# Patient Record
Sex: Male | Born: 1960 | Race: White | Hispanic: No | Marital: Single | Smoking: Never smoker
Health system: Southern US, Community
[De-identification: ages and names within clinical notes are randomized; demographics above are authoritative.]

## PROBLEM LIST (undated history)

## (undated) DIAGNOSIS — M1990 Unspecified osteoarthritis, unspecified site: Secondary | ICD-10-CM

## (undated) DIAGNOSIS — K648 Other hemorrhoids: Secondary | ICD-10-CM

## (undated) DIAGNOSIS — F32A Depression, unspecified: Secondary | ICD-10-CM

## (undated) DIAGNOSIS — G709 Myoneural disorder, unspecified: Secondary | ICD-10-CM

## (undated) DIAGNOSIS — Z8616 Personal history of COVID-19: Secondary | ICD-10-CM

## (undated) DIAGNOSIS — Z973 Presence of spectacles and contact lenses: Secondary | ICD-10-CM

## (undated) DIAGNOSIS — F419 Anxiety disorder, unspecified: Secondary | ICD-10-CM

## (undated) DIAGNOSIS — F329 Major depressive disorder, single episode, unspecified: Secondary | ICD-10-CM

## (undated) DIAGNOSIS — K219 Gastro-esophageal reflux disease without esophagitis: Secondary | ICD-10-CM

## (undated) DIAGNOSIS — K311 Adult hypertrophic pyloric stenosis: Secondary | ICD-10-CM

## (undated) DIAGNOSIS — G629 Polyneuropathy, unspecified: Secondary | ICD-10-CM

## (undated) DIAGNOSIS — E785 Hyperlipidemia, unspecified: Secondary | ICD-10-CM

## (undated) DIAGNOSIS — Z5189 Encounter for other specified aftercare: Secondary | ICD-10-CM

## (undated) DIAGNOSIS — IMO0002 Reserved for concepts with insufficient information to code with codable children: Secondary | ICD-10-CM

## (undated) HISTORY — DX: Major depressive disorder, single episode, unspecified: F32.9

## (undated) HISTORY — PX: BACK SURGERY: SHX140

## (undated) HISTORY — DX: Depression, unspecified: F32.A

## (undated) HISTORY — PX: FOOT SURGERY: SHX648

## (undated) HISTORY — DX: Hyperlipidemia, unspecified: E78.5

## (undated) HISTORY — DX: Reserved for concepts with insufficient information to code with codable children: IMO0002

## (undated) HISTORY — DX: Adult hypertrophic pyloric stenosis: K31.1

## (undated) HISTORY — DX: Myoneural disorder, unspecified: G70.9

## (undated) HISTORY — PX: OTHER SURGICAL HISTORY: SHX169

## (undated) HISTORY — DX: Polyneuropathy, unspecified: G62.9

## (undated) HISTORY — DX: Gastro-esophageal reflux disease without esophagitis: K21.9

## (undated) HISTORY — DX: Encounter for other specified aftercare: Z51.89

---

## 1998-03-31 DIAGNOSIS — Z5189 Encounter for other specified aftercare: Secondary | ICD-10-CM

## 1998-03-31 DIAGNOSIS — S060XAA Concussion with loss of consciousness status unknown, initial encounter: Secondary | ICD-10-CM

## 1998-03-31 DIAGNOSIS — IMO0002 Reserved for concepts with insufficient information to code with codable children: Secondary | ICD-10-CM

## 1998-03-31 HISTORY — DX: Encounter for other specified aftercare: Z51.89

## 1998-03-31 HISTORY — DX: Concussion with loss of consciousness status unknown, initial encounter: S06.0XAA

## 1998-03-31 HISTORY — PX: OTHER SURGICAL HISTORY: SHX169

## 1998-03-31 HISTORY — DX: Reserved for concepts with insufficient information to code with codable children: IMO0002

## 1998-03-31 HISTORY — PX: ANKLE FRACTURE SURGERY: SHX122

## 2008-08-10 ENCOUNTER — Inpatient Hospital Stay (HOSPITAL_COMMUNITY): Admission: EM | Admit: 2008-08-10 | Discharge: 2008-08-14 | Payer: Self-pay | Admitting: Emergency Medicine

## 2009-03-31 DIAGNOSIS — G5603 Carpal tunnel syndrome, bilateral upper limbs: Secondary | ICD-10-CM | POA: Insufficient documentation

## 2010-07-09 LAB — BASIC METABOLIC PANEL
CO2: 30 mEq/L (ref 19–32)
CO2: 31 mEq/L (ref 19–32)
Calcium: 8.7 mg/dL (ref 8.4–10.5)
Calcium: 8.2 mg/dL — ABNORMAL LOW (ref 8.4–10.5)
Chloride: 101 mEq/L (ref 96–112)
Chloride: 104 mEq/L (ref 96–112)
Chloride: 99 mEq/L (ref 96–112)
Creatinine, Ser: 0.71 mg/dL (ref 0.4–1.5)
GFR calc Af Amer: >60 mL/min (ref >60)
GFR calc Af Amer: >60 mL/min (ref >60)
GFR calc Af Amer: >60 mL/min (ref >60)
GFR calc non Af Amer: >60 mL/min (ref >60)
Glucose, Bld: 104 mg/dL — ABNORMAL HIGH (ref 70–99)
Potassium: 3.8 mEq/L (ref 3.5–5.1)
Sodium: 135 mEq/L (ref 135–145)
Sodium: 137 mEq/L (ref 135–145)

## 2010-07-09 LAB — CULTURE, BLOOD (ROUTINE X 2)
Culture: NO GROWTH
Culture: NO GROWTH

## 2010-07-09 LAB — CBC
HCT: 37.3 % — ABNORMAL LOW (ref 39.0–52.0)
HCT: 42.6 % (ref 39.0–52.0)
Hemoglobin: 12.6 g/dL — ABNORMAL LOW (ref 13.0–17.0)
Hemoglobin: 14.5 g/dL (ref 13.0–17.0)
MCHC: 33.7 g/dL (ref 30.0–36.0)
MCHC: 34.1 g/dL (ref 30.0–36.0)
MCV: 93.3 fL (ref 78.0–100.0)
MCV: 92.7 fL (ref 78.0–100.0)
MCV: 94.1 fL (ref 78.0–100.0)
RBC: 3.93 MIL/uL — ABNORMAL LOW (ref 4.22–5.81)
RBC: 3.96 MIL/uL — ABNORMAL LOW (ref 4.22–5.81)
RBC: 4.6 MIL/uL (ref 4.22–5.81)
RDW: 13.6 % (ref 11.5–15.5)
WBC: 5.9 10*3/uL (ref 4.0–10.5)

## 2010-07-09 LAB — DIFFERENTIAL
Basophils Relative: 1 % (ref 0–1)
Eosinophils Absolute: 0.1 10*3/uL (ref 0.0–0.7)
Lymphs Abs: 1.2 10*3/uL (ref 0.7–4.0)
Monocytes Absolute: 0.4 10*3/uL (ref 0.1–1.0)
Monocytes Relative: 4 % (ref 3–12)

## 2010-07-09 LAB — WOUND CULTURE

## 2010-07-09 LAB — RAPID URINE DRUG SCREEN, HOSP PERFORMED
Barbiturates: NOT DETECTED
Opiates: POSITIVE — AB
Tetrahydrocannabinol: NOT DETECTED
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
## 2011-04-01 HISTORY — PX: FOOT SURGERY: SHX648

## 2013-02-10 DIAGNOSIS — M545 Low back pain, unspecified: Secondary | ICD-10-CM | POA: Insufficient documentation

## 2013-02-11 ENCOUNTER — Other Ambulatory Visit: Payer: Self-pay | Admitting: Neurosurgery

## 2013-02-11 DIAGNOSIS — M545 Low back pain, unspecified: Secondary | ICD-10-CM

## 2013-02-17 ENCOUNTER — Ambulatory Visit
Admission: RE | Admit: 2013-02-17 | Discharge: 2013-02-17 | Disposition: A | Discharge status: Home / Self Care | Payer: Medicare Other | Source: Ambulatory Visit | Attending: Neurosurgery | Admitting: Neurosurgery

## 2013-02-17 ENCOUNTER — Other Ambulatory Visit: Payer: Self-pay | Admitting: Neurosurgery

## 2013-02-17 DIAGNOSIS — M545 Low back pain, unspecified: Secondary | ICD-10-CM

## 2013-02-20 ENCOUNTER — Other Ambulatory Visit: Payer: Self-pay | Admitting: Neurosurgery

## 2013-02-20 ENCOUNTER — Other Ambulatory Visit: Payer: Medicare Other

## 2013-02-20 ENCOUNTER — Ambulatory Visit
Admission: RE | Admit: 2013-02-20 | Discharge: 2013-02-20 | Disposition: A | Discharge status: Home / Self Care | Payer: Medicare Other | Source: Ambulatory Visit | Attending: Neurosurgery | Admitting: Neurosurgery

## 2013-02-20 DIAGNOSIS — M545 Low back pain, unspecified: Secondary | ICD-10-CM

## 2013-03-04 ENCOUNTER — Other Ambulatory Visit: Payer: Self-pay | Admitting: Neurosurgery

## 2013-03-04 ENCOUNTER — Encounter: Payer: Self-pay | Admitting: Internal Medicine

## 2013-03-04 DIAGNOSIS — M545 Low back pain, unspecified: Secondary | ICD-10-CM

## 2013-03-09 ENCOUNTER — Ambulatory Visit (AMBULATORY_SURGERY_CENTER): Payer: Self-pay | Admitting: *Deleted

## 2013-03-09 VITALS — Ht 71.0 in | Wt 170.2 lb

## 2013-03-09 DIAGNOSIS — Z1211 Encounter for screening for malignant neoplasm of colon: Secondary | ICD-10-CM

## 2013-03-09 MED ORDER — MOVIPREP 100 G PO SOLR: ORAL | Status: DC

## 2013-03-09 NOTE — Progress Notes (Signed)
No allergies to eggs or soy. No problems with anesthesia.  

## 2013-03-10 ENCOUNTER — Ambulatory Visit
Admission: RE | Admit: 2013-03-10 | Discharge: 2013-03-10 | Disposition: A | Discharge status: Home / Self Care | Payer: Medicare Other | Source: Ambulatory Visit | Attending: Neurosurgery | Admitting: Neurosurgery

## 2013-03-10 VITALS — BP 118/67 | HR 42

## 2013-03-10 DIAGNOSIS — M545 Low back pain, unspecified: Secondary | ICD-10-CM

## 2013-03-10 MED ORDER — ONDANSETRON HCL 4 MG/2ML IJ SOLN: 4.0000 mg | Freq: Four times a day (QID) | INTRAMUSCULAR | Status: DC | PRN

## 2013-03-10 MED ORDER — IOHEXOL 180 MG/ML  SOLN
15.0000 mL | Freq: Once | INTRAMUSCULAR | Status: AC | PRN
  Administered 2013-03-10: 15 mL via INTRATHECAL

## 2013-03-10 MED ORDER — DIAZEPAM 5 MG PO TABS
10.0000 mg | ORAL_TABLET | Freq: Once | ORAL | Status: AC
  Administered 2013-03-10: 10 mg via ORAL

## 2013-03-10 MED ORDER — DIAZEPAM 5 MG PO TABS: 10.0000 mg | ORAL_TABLET | Freq: Once | ORAL | Status: DC

## 2013-03-10 NOTE — Progress Notes (Signed)
Patient states he has been off Effexor for the past two days.

## 2013-03-28 ENCOUNTER — Encounter: Payer: Self-pay | Admitting: Internal Medicine

## 2013-03-28 ENCOUNTER — Ambulatory Visit (AMBULATORY_SURGERY_CENTER): Payer: Medicare Other | Admitting: Internal Medicine

## 2013-03-28 VITALS — BP 116/54 | HR 51 | Temp 97.3°F | Resp 15 | Ht 71.0 in | Wt 170.0 lb

## 2013-03-28 DIAGNOSIS — D126 Benign neoplasm of colon, unspecified: Secondary | ICD-10-CM

## 2013-03-28 DIAGNOSIS — Z1211 Encounter for screening for malignant neoplasm of colon: Secondary | ICD-10-CM

## 2013-03-28 MED ORDER — SODIUM CHLORIDE 0.9 % IV SOLN: 500.0000 mL | INTRAVENOUS | Status: DC

## 2013-03-28 NOTE — Progress Notes (Signed)
Stable to RR 

## 2013-03-28 NOTE — Patient Instructions (Signed)

## 2013-03-28 NOTE — Op Note (Signed)
[facility] [address] [place], [address]   COLONOSCOPY PROCEDURE REPORT  PATIENT: [name], [name]  MR#: [id number] BIRTHDATE: 24-Nov-1960 , 52  yrs. old GENDER: Male ENDOSCOPIST: Beverley Fiedler, MD PROCEDURE DATE:  03/28/2013 PROCEDURE:   Colonoscopy with snare polypectomy First Screening Colonoscopy - Avg.  risk and is 50 yrs.  old or older Yes.  Prior Negative Screening - Now for repeat screening. N/A  History of Adenoma - Now for follow-up colonoscopy & has been > or = to 3 yrs.  N/A  Polyps Removed Today? Yes. ASA CLASS:   Class II INDICATIONS:average risk screening and first colonoscopy. MEDICATIONS: MAC sedation, administered by CRNA and propofol (Diprivan) 500mg  IV  DESCRIPTION OF PROCEDURE:   After the risks benefits and alternatives of the procedure were thoroughly explained, informed consent was obtained.  A digital rectal exam revealed no rectal mass.   The LB [id number]  endoscope was introduced through the anus and advanced to the cecum, which was identified by both the appendix and ileocecal valve. No adverse events experienced. The quality of the prep was Moviprep fair  The instrument was then slowly withdrawn as the colon was fully examined.   COLON FINDINGS: A sessile polyp measuring 5 mm in size was found at the cecum.  A polypectomy was performed with a cold snare.  The resection was complete and the polyp tissue was completely retrieved.   The colon mucosa was otherwise normal. Retroflexed views revealed internal hemorrhoids. The time to cecum=7 minutes 27 seconds.  Withdrawal time=11 minutes 14 seconds.  The scope was withdrawn and the procedure completed.  COMPLICATIONS: There were no complications.  ENDOSCOPIC IMPRESSION: 1.   Sessile polyp measuring 5 mm in size was found at the cecum; polypectomy was performed with a cold snare 2.   The colon mucosa was otherwise normal 3.   Small to moderate internal  hemorrhoids  RECOMMENDATIONS: 1.  Await pathology results 2.  If the polyp removed today is proven to be an adenomatous (pre-cancerous) polyp, you will need a repeat colonoscopy in 5 years.  Otherwise you should continue to follow colorectal cancer screening guidelines for "routine risk" patients with colonoscopy in 10 years.  You will receive a letter within 1-2 weeks with the results of your biopsy as well as final recommendations.  Please call my office if you have not received a letter after 3 weeks.   eSigned:  Beverley Fiedler, MD 03/28/2013 8:57 AM  cc: The Patient

## 2013-03-28 NOTE — Progress Notes (Signed)
Called to room to assist during endoscopic procedure.  Patient ID and intended procedure confirmed with present staff. Received instructions for my participation in the procedure from the performing physician.  

## 2013-03-28 NOTE — Progress Notes (Signed)
Pt only passed a small amount of air while in recovery, pt abd was soft, non-tender, non-distended upon discharge, pt denies abd cramping or pain, instructions were given to pt for passing air, and what to do at home to help get any additional air out, pt states understanding and denies the need to pass air at this time,discharged home-adm

## 2013-03-29 ENCOUNTER — Telehealth: Payer: Self-pay

## 2013-03-29 NOTE — Telephone Encounter (Signed)
No answer, left message to call [facility] if questions or concerns following procedure 03/28/2013

## 2013-04-03 ENCOUNTER — Encounter: Payer: Self-pay | Admitting: Internal Medicine

## 2013-07-27 DIAGNOSIS — M48061 Spinal stenosis, lumbar region without neurogenic claudication: Secondary | ICD-10-CM | POA: Insufficient documentation

## 2013-07-27 DIAGNOSIS — G96198 Other disorders of meninges, not elsewhere classified: Secondary | ICD-10-CM | POA: Insufficient documentation

## 2014-05-12 ENCOUNTER — Ambulatory Visit (INDEPENDENT_AMBULATORY_CARE_PROVIDER_SITE_OTHER): Payer: Medicare Other | Admitting: Internal Medicine

## 2014-05-12 DIAGNOSIS — Z23 Encounter for immunization: Secondary | ICD-10-CM

## 2014-05-12 DIAGNOSIS — Z9189 Other specified personal risk factors, not elsewhere classified: Secondary | ICD-10-CM

## 2014-05-12 MED ORDER — DOXYCYCLINE HYCLATE 100 MG PO TABS: 100.0000 mg | ORAL_TABLET | Freq: Every day | ORAL | Status: DC

## 2014-05-12 MED ORDER — CIPROFLOXACIN HCL 500 MG PO TABS: 500.0000 mg | ORAL_TABLET | Freq: Two times a day (BID) | ORAL | Status: DC

## 2014-05-12 MED ORDER — AZITHROMYCIN 500 MG PO TABS: 500.0000 mg | ORAL_TABLET | Freq: Every day | ORAL | Status: DC

## 2014-05-12 MED ORDER — ACETAZOLAMIDE 125 MG PO TABS: 125.0000 mg | ORAL_TABLET | Freq: Two times a day (BID) | ORAL | Status: DC

## 2014-05-12 MED ORDER — TYPHOID VACCINE PO CPDR: 1.0000 | DELAYED_RELEASE_CAPSULE | ORAL | Status: DC
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
## 2016-03-03 ENCOUNTER — Telehealth: Payer: Self-pay | Admitting: *Deleted

## 2016-03-03 ENCOUNTER — Ambulatory Visit: Payer: Medicare Other | Admitting: Neurology

## 2016-03-03 NOTE — Telephone Encounter (Signed)
No showed new patient appointment. 

## 2016-03-06 ENCOUNTER — Encounter: Payer: Self-pay | Admitting: Neurology

## 2016-05-22 DIAGNOSIS — N486 Induration penis plastica: Secondary | ICD-10-CM | POA: Insufficient documentation

## 2017-02-27 ENCOUNTER — Encounter: Payer: Self-pay | Admitting: Internal Medicine

## 2017-02-27 ENCOUNTER — Ambulatory Visit (INDEPENDENT_AMBULATORY_CARE_PROVIDER_SITE_OTHER): Payer: Medicare Other | Admitting: Internal Medicine

## 2017-02-27 DIAGNOSIS — Z23 Encounter for immunization: Secondary | ICD-10-CM

## 2017-02-27 DIAGNOSIS — Z789 Other specified health status: Secondary | ICD-10-CM

## 2017-02-27 DIAGNOSIS — Z9189 Other specified personal risk factors, not elsewhere classified: Secondary | ICD-10-CM

## 2017-02-27 DIAGNOSIS — Z7184 Encounter for health counseling related to travel: Secondary | ICD-10-CM

## 2017-02-27 DIAGNOSIS — Z7189 Other specified counseling: Secondary | ICD-10-CM

## 2017-02-27 MED ORDER — ATOVAQUONE-PROGUANIL HCL 250-100 MG PO TABS: 1.0000 | ORAL_TABLET | Freq: Every day | ORAL | 0 refills | Status: DC

## 2017-02-27 MED ORDER — AZITHROMYCIN 500 MG PO TABS: 1000.0000 mg | ORAL_TABLET | Freq: Once | ORAL | 0 refills | Status: AC

## 2017-02-27 NOTE — Patient Instructions (Signed)
[facility]                [address] [place]                   [place], [place] [address]                      Phone: [phone]                        Fax: [phone]   Planned departure date: February 2019          Countries of travel: [place], [place] , [place], [place], [place], [place], [place], [place] and [place] for the Prevention & Treatment of Traveler's Diarrhea  Prevention: "Boil it, Peel it, Lacinda Axon it, or Forget it"   the fewer chances -> lower risk: try to stick to food & water precautions as much as possible"   If it's "piping hot"; it is probably okay, if not, it may not be   Treatment   1) You should always take care to drink lots of fluids in order to avoid dehydration   2) You should bring medications with you in case you come down with a case of diarrhea   3) OTC = bring pepto-bismol - can take with initial abdominal symptoms;                    Imodium - can help slow down your intestinal tract, can help relief cramps                    and diarrhea, can take if no bloody diarrhea  Use azithromycin if needed for traveler's diarrhea  Guidelines for the Prevention of Malaria  Avoidance:  -fewer mosquito bites = lower risk. Mosquitos can bite at night as well as daytime  -cover up (long sleeve clothing), mosquito nets, screens  -Insect repellent for your skin ( DEET containing lotion > 20%): for clothes ( permethrin spray)  2 days prior to travel, start malarone, daily dose starting 1-2 days before entering endemic area, ending 7 days after leaving area for malaria prevention.   Immunizations received today: Hepatitis A series  Future immunizations, if indicated none indicated   Prior to travel:  1) Be sure to pick up appropriate prescriptions, including medicine you take daily. Do not expect to be able to fill your prescriptions abroad.  2) Strongly consider obtaining traveler's  insurance, including emergency evacuation insurance. Most plans in the [place] do not cover participants abroad. (see below for resources)  3) Register at the appropriate [place] embassy or consulate with travel dates so they are aware of your presence in-country and for helpful advice during travel using the [employer] (STEP, GreenNylon.com.cy).  4) Leave contact information with a relative or friend.  5) Keep a Research officer, political party, credit cards in case they become lost or stolen  6) Inform your credit card company that you will be travelling abroad   During travel:  1) If you become ill and need medical advice, the [place] [employer]	[place] of the country you are traveling in general provides a list of [place] speaking doctors.  We are also available on MyChart for remote consultation if you register prior to travel. 2) Avoid motorcycles or scooters when at all possible. Traffic laws in many countries are lax and accidents  occur frequently.  3) Do not take any unnecessary risks that you wouldn't do at home.   Resources:  -Country specific information: BlindResource.ca or GreenNylon.com.cy  -Press photographer (DEET, mosquito nets): [employer], [employer] store, [employer], Gall[employer] insurance options: gatewayplans.com; http://clayton-rivera.info/; travelguard.com or Good [employer], gninsurance.com or info@gninsurance .com, H1235423.   Post Travel:  If you return from your trip ill, call your primary care doctor or our travel clinic @ [phone].   Enjoy your trip and know that with proper pre-travel preparation, most people have an enjoyable and uninterrupted trip!

## 2017-02-27 NOTE — Progress Notes (Signed)
Subjective:   [name] is a 56 y.o. male who presents to the Infectious Disease clinic for travel consultation. Planned departure date: February 2019          Planned return date: 4 months Countries of travel: [place], [place] , [place], [place], [place], [place], [place], [place], [place] and [place] Areas in country: rural and urban   Accommodations: hotel and private home Purpose of travel: vacation Prior travel out of [place]: yes     Objective:   Medications: reviewed    Assessment:   No contraindications to travel. none     Plan:    Issues discussed: altitude illness, environmental concerns, freshwater swimming, future shots, insect-borne illnesses, Japanese encephalitis, malaria, MVA safety, rabies, safe food/water, traveler's diarrhea, website/handouts for more information, what to do if ill upon return, what to do if ill while there and Yellow Fever. Immunizations recommended: Hepatitis A series and Japenese encephalitis discussed and offered but he felt it was not necessary. Malaria prophylaxis: malarone, daily dose starting 1-2 days before entering endemic area, ending 7 days after leaving area Traveler's diarrhea prophylaxis: azithromycin. Total duration of visit: 1 Hour. Total time spent on education, counseling, coordination of care: 30 Minutes.

## 2017-11-12 ENCOUNTER — Emergency Department (HOSPITAL_COMMUNITY): Payer: Medicare Other

## 2017-11-12 ENCOUNTER — Encounter (HOSPITAL_COMMUNITY): Payer: Self-pay | Admitting: Emergency Medicine

## 2017-11-12 ENCOUNTER — Emergency Department (HOSPITAL_COMMUNITY)
Admission: EM | Admit: 2017-11-12 | Discharge: 2017-11-12 | Disposition: A | Discharge status: Home / Self Care | Payer: Medicare Other | Attending: Emergency Medicine | Admitting: Emergency Medicine

## 2017-11-12 DIAGNOSIS — Z79899 Other long term (current) drug therapy: Secondary | ICD-10-CM | POA: Diagnosis not present

## 2017-11-12 DIAGNOSIS — R200 Anesthesia of skin: Secondary | ICD-10-CM | POA: Insufficient documentation

## 2017-11-12 DIAGNOSIS — G510 Bell's palsy: Secondary | ICD-10-CM

## 2017-11-12 DIAGNOSIS — R29810 Facial weakness: Secondary | ICD-10-CM | POA: Diagnosis present

## 2017-11-12 DIAGNOSIS — H5789 Other specified disorders of eye and adnexa: Secondary | ICD-10-CM | POA: Insufficient documentation

## 2017-11-12 LAB — COMPREHENSIVE METABOLIC PANEL
ALT: 14 U/L (ref 0–44)
AST: 16 U/L (ref 15–41)
Albumin: 4 g/dL (ref 3.5–5.0)
Alkaline Phosphatase: 69 U/L (ref 38–126)
Anion gap: 7 (ref 5–15)
BUN: 12 mg/dL (ref 6–20)
CHLORIDE: 107 mmol/L (ref 98–111)
CO2: 28 mmol/L (ref 22–32)
CREATININE: 0.83 mg/dL (ref 0.61–1.24)
Calcium: 9.2 mg/dL (ref 8.9–10.3)
GFR calc Af Amer: >60 mL/min (ref >60)
Glucose, Bld: 88 mg/dL (ref 70–99)
Potassium: 3.7 mmol/L (ref 3.5–5.1)
SODIUM: 142 mmol/L (ref 135–145)
Total Bilirubin: 0.7 mg/dL (ref 0.3–1.2)
Total Protein: 6.5 g/dL (ref 6.5–8.1)

## 2017-11-12 LAB — CBC
HEMATOCRIT: 47.3 % (ref 39.0–52.0)
HEMOGLOBIN: 15.8 g/dL (ref 13.0–17.0)
MCH: 30.1 pg (ref 26.0–34.0)
MCHC: 33.4 g/dL (ref 30.0–36.0)
MCV: 90.1 fL (ref 78.0–100.0)
Platelets: 175 10*3/uL (ref 150–400)
RBC: 5.25 MIL/uL (ref 4.22–5.81)
RDW: 12.3 % (ref 11.5–15.5)
WBC: 4 10*3/uL (ref 4.0–10.5)

## 2017-11-12 LAB — APTT: aPTT: 28 seconds (ref 24–36)

## 2017-11-12 LAB — DIFFERENTIAL
ABS IMMATURE GRANULOCYTES: 0 10*3/uL (ref 0.0–0.1)
BASOS PCT: 0 %
Basophils Absolute: 0 10*3/uL (ref 0.0–0.1)
Eosinophils Absolute: 0.1 10*3/uL (ref 0.0–0.7)
Eosinophils Relative: 2 %
Immature Granulocytes: 0 %
LYMPHS PCT: 55 %
Lymphs Abs: 2.2 10*3/uL (ref 0.7–4.0)
MONOS PCT: 7 %
Monocytes Absolute: 0.3 10*3/uL (ref 0.1–1.0)
NEUTROS ABS: 1.4 10*3/uL — AB (ref 1.7–7.7)
NEUTROS PCT: 36 %

## 2017-11-12 LAB — I-STAT TROPONIN, ED: Troponin i, poc: 0 ng/mL (ref 0.00–0.08)

## 2017-11-12 LAB — PROTIME-INR
INR: 1.02
Prothrombin Time: 13.3 seconds (ref 11.4–15.2)

## 2017-11-12 MED ORDER — PREDNISONE 20 MG PO TABS
60.0000 mg | ORAL_TABLET | Freq: Once | ORAL | Status: AC
  Administered 2017-11-12: 60 mg via ORAL
  Filled 2017-11-12: qty 3

## 2017-11-12 MED ORDER — PREDNISOLONE 5 MG PO TABS: 25.0000 mg | ORAL_TABLET | Freq: Two times a day (BID) | ORAL | 0 refills | Status: AC

## 2017-11-12 NOTE — ED Notes (Signed)
Pt returned from MRI °

## 2017-11-12 NOTE — Discharge Instructions (Addendum)
Make an appointment to follow-up with neurology.  Take the prednisone as directed.  Continue to use your liquid tears in your right eye.  Return for any new or worse symptoms.  Recommend that she cancel your trip to [place].  The symptoms can take a long time to resolve and close follow-up does help to improve outcome.

## 2017-11-12 NOTE — ED Notes (Signed)
This RN offered to slide patient up in the bed. Pt declined and stated that he was comfortable.

## 2017-11-12 NOTE — ED Notes (Signed)
Pt transported to MRI 
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
## 2017-11-12 NOTE — ED Notes (Signed)
Patient verbalizes understanding of discharge instructions. Opportunity for questioning and answers were provided. Armband removed by staff, pt discharged from ED.  

## 2017-11-12 NOTE — ED Triage Notes (Addendum)
Pt reports right sided facial  Numbness and paralysis that began at 0700 yesterday morning. Pt denies any other symptoms, speech clear, grip strength equal bilaterally, pt a/ox4, resp e/u, nad. Pt does have gait abnormality and numbness of lower right leg due to previous spinal cord injury.

## 2018-01-20 ENCOUNTER — Encounter: Payer: Self-pay | Admitting: Internal Medicine

## 2018-02-23 ENCOUNTER — Ambulatory Visit (AMBULATORY_SURGERY_CENTER): Payer: Self-pay

## 2018-02-23 VITALS — Ht 71.0 in | Wt 175.6 lb

## 2018-02-23 DIAGNOSIS — Z86010 Personal history of colonic polyps: Secondary | ICD-10-CM

## 2018-02-23 MED ORDER — NA SULFATE-K SULFATE-MG SULF 17.5-3.13-1.6 GM/177ML PO SOLN: 1.0000 | Freq: Once | ORAL | 0 refills | Status: AC

## 2018-02-23 NOTE — Progress Notes (Signed)
Denies allergies to eggs or soy products. Denies complication of anesthesia or sedation. Denies use of weight loss medication. Denies use of O2.   Emmi instructions declined.   Originally Prashant was given a 2 day prep using Suprep. Patient states that he can not do the two day prep due to something that he had scheduled. Patient states he does not have constipation. In further discussion I found out that he did not finish his prep the last time he had a colonoscopy. Patient agreed to do the one day prep. I did inform him that he must complete the entire prep and he must get two 16 oz of water. I gave him several suggestions to help him complete the prep. Patient verbalizes understanding. Pre-Visit took over an hour.

## 2018-03-04 ENCOUNTER — Ambulatory Visit (AMBULATORY_SURGERY_CENTER): Payer: Medicare Other | Admitting: Internal Medicine

## 2018-03-04 ENCOUNTER — Encounter: Payer: Self-pay | Admitting: Internal Medicine

## 2018-03-04 VITALS — BP 118/71 | HR 58 | Temp 97.3°F | Resp 10 | Ht 71.0 in | Wt 175.0 lb

## 2018-03-04 DIAGNOSIS — Z86010 Personal history of colonic polyps: Secondary | ICD-10-CM

## 2018-03-04 DIAGNOSIS — D125 Benign neoplasm of sigmoid colon: Secondary | ICD-10-CM

## 2018-03-04 DIAGNOSIS — D120 Benign neoplasm of cecum: Secondary | ICD-10-CM

## 2018-03-04 HISTORY — PX: OTHER SURGICAL HISTORY: SHX169

## 2018-03-04 MED ORDER — SODIUM CHLORIDE 0.9 % IV SOLN: 500.0000 mL | Freq: Once | INTRAVENOUS | Status: DC

## 2018-03-04 NOTE — Progress Notes (Signed)
To [facility], VSS Report to [email or web address]

## 2018-03-04 NOTE — Progress Notes (Signed)
Called to room to assist during endoscopic procedure.  Patient ID and intended procedure confirmed with present staff. Received instructions for my participation in the procedure from the performing physician.  

## 2018-03-04 NOTE — Op Note (Signed)
[facility] Patient Name: [name] Procedure Date: 03/04/2018 8:53 AM MRN: [id number] Endoscopist: Jerene Bears , MD Age: 57 Referring MD:  Date of Birth: June 22, 1960 Gender: Male Account #: [id number] Procedure:                Colonoscopy Indications:              Surveillance: Personal history of adenomatous                            polyps on last colonoscopy 5 years ago Medicines:                Monitored Anesthesia Care Procedure:                Pre-Anesthesia Assessment:                           - Prior to the procedure, a History and Physical                            was performed, and patient medications and                            allergies were reviewed. The patient's tolerance of                            previous anesthesia was also reviewed. The risks                            and benefits of the procedure and the sedation                            options and risks were discussed with the patient.                            All questions were answered, and informed consent                            was obtained. Prior Anticoagulants: The patient has                            taken no previous anticoagulant or antiplatelet                            agents. ASA Grade Assessment: II - A patient with                            mild systemic disease. After reviewing the risks                            and benefits, the patient was deemed in                            satisfactory condition to undergo the procedure.  After obtaining informed consent, the colonoscope                            was passed under direct vision. Throughout the                            procedure, the patient's blood pressure, pulse, and                            oxygen saturations were monitored continuously. The                            Model CF-HQ190L [id number]) scope was introduced                            through the anus and  advanced to the surgical                            stoma. The colonoscopy was performed without                            difficulty. The patient tolerated the procedure                            well. The quality of the bowel preparation was                            good. The ileocecal valve, appendiceal orifice, and                            rectum were photographed. Scope In: 8:54:09 AM Scope Out: 9:14:11 AM Scope Withdrawal Time: 0 hours 12 minutes 23 seconds  Total Procedure Duration: 0 hours 20 minutes 2 seconds  Findings:                 The digital rectal exam was normal.                           A 6 mm polyp was found in the cecum. The polyp was                            sessile. The polyp was removed with a cold snare.                            Resection and retrieval were complete.                           A 5 mm polyp was found in the sigmoid colon. The                            polyp was sessile. The polyp was removed with a                            cold snare.  Resection and retrieval were complete.                           Internal hemorrhoids were found during                            retroflexion. The hemorrhoids were medium-sized. Complications:            No immediate complications. Estimated Blood Loss:     Estimated blood loss was minimal. Impression:               - One 6 mm polyp in the cecum, removed with a cold                            snare. Resected and retrieved.                           - One 5 mm polyp in the sigmoid colon, removed with                            a cold snare. Resected and retrieved.                           - Internal hemorrhoids. Recommendation:           - Patient has a contact number available for                            emergencies. The signs and symptoms of potential                            delayed complications were discussed with the                            patient. Return to normal activities tomorrow.                             Written discharge instructions were provided to the                            patient.                           - Resume previous diet.                           - Continue present medications.                           - Await pathology results.                           - Repeat colonoscopy is recommended for                            surveillance. The colonoscopy date will be  determined after pathology results from today's                            exam become available for review. Jerene Bears, MD 03/04/2018 9:18:33 AM This report has been signed electronically.

## 2018-03-04 NOTE — Patient Instructions (Signed)
Handouts given for polyps and hemorrhoids  YOU HAD AN ENDOSCOPIC PROCEDURE TODAY AT [facility]:   Refer to the procedure report that was given to you for any specific questions about what was found during the examination.  If the procedure report does not answer your questions, please call your gastroenterologist to clarify.  If you requested that your care partner not be given the details of your procedure findings, then the procedure report has been included in a sealed envelope for you to review at your convenience later.  YOU SHOULD EXPECT: Some feelings of bloating in the abdomen. Passage of more gas than usual.  Walking can help get rid of the air that was put into your GI tract during the procedure and reduce the bloating. If you had a lower endoscopy (such as a colonoscopy or flexible sigmoidoscopy) you may notice spotting of blood in your stool or on the toilet paper. If you underwent a bowel prep for your procedure, you may not have a normal bowel movement for a few days.  Please Note:  You might notice some irritation and congestion in your nose or some drainage.  This is from the oxygen used during your procedure.  There is no need for concern and it should clear up in a day or so.  SYMPTOMS TO REPORT IMMEDIATELY:   Following lower endoscopy (colonoscopy or flexible sigmoidoscopy):  Excessive amounts of blood in the stool  Significant tenderness or worsening of abdominal pains  Swelling of the abdomen that is new, acute  Fever of 100F or higher   For urgent or emergent issues, a gastroenterologist can be reached at any hour by calling [phone].   DIET:  We do recommend a small meal at first, but then you may proceed to your regular diet.  Drink plenty of fluids but you should avoid alcoholic beverages for 24 hours.  ACTIVITY:  You should plan to take it easy for the rest of today and you should NOT DRIVE or use heavy machinery until tomorrow (because of the  sedation medicines used during the test).    FOLLOW UP: Our staff will call the number listed on your records the next business day following your procedure to check on you and address any questions or concerns that you may have regarding the information given to you following your procedure. If we do not reach you, we will leave a message.  However, if you are feeling well and you are not experiencing any problems, there is no need to return our call.  We will assume that you have returned to your regular daily activities without incident.  If any biopsies were taken you will be contacted by phone or by letter within the next 1-3 weeks.  Please call us at [phone] if you have not heard about the biopsies in 3 weeks.    SIGNATURES/CONFIDENTIALITY: You and/or your care partner have signed paperwork which will be entered into your electronic medical record.  These signatures attest to the fact that that the information above on your After Visit Summary has been reviewed and is understood.  Full responsibility of the confidentiality of this discharge information lies with you and/or your care-partner.

## 2018-03-05 ENCOUNTER — Ambulatory Visit (INDEPENDENT_AMBULATORY_CARE_PROVIDER_SITE_OTHER): Payer: Self-pay | Admitting: Internal Medicine

## 2018-03-05 ENCOUNTER — Telehealth: Payer: Self-pay

## 2018-03-05 ENCOUNTER — Encounter: Payer: Self-pay | Admitting: Internal Medicine

## 2018-03-05 DIAGNOSIS — Z7184 Encounter for health counseling related to travel: Secondary | ICD-10-CM

## 2018-03-05 DIAGNOSIS — Z7185 Encounter for immunization safety counseling: Secondary | ICD-10-CM

## 2018-03-05 DIAGNOSIS — Z0000 Encounter for general adult medical examination without abnormal findings: Secondary | ICD-10-CM | POA: Insufficient documentation

## 2018-03-05 DIAGNOSIS — Z298 Encounter for other specified prophylactic measures: Secondary | ICD-10-CM

## 2018-03-05 DIAGNOSIS — Z789 Other specified health status: Secondary | ICD-10-CM

## 2018-03-05 DIAGNOSIS — Z7189 Other specified counseling: Secondary | ICD-10-CM

## 2018-03-05 DIAGNOSIS — Z9189 Other specified personal risk factors, not elsewhere classified: Secondary | ICD-10-CM

## 2018-03-05 MED ORDER — CEPHALEXIN 500 MG PO CAPS: 500.0000 mg | ORAL_CAPSULE | Freq: Four times a day (QID) | ORAL | 0 refills | Status: DC

## 2018-03-05 MED ORDER — ATOVAQUONE-PROGUANIL HCL 250-100 MG PO TABS: 1.0000 | ORAL_TABLET | Freq: Every day | ORAL | 0 refills | Status: DC

## 2018-03-05 MED ORDER — AZITHROMYCIN 500 MG PO TABS: 1000.0000 mg | ORAL_TABLET | Freq: Once | ORAL | 0 refills | Status: AC

## 2018-03-05 NOTE — Progress Notes (Signed)
Subjective:   Sonia Stickels is a 57 y.o. male who presents to the Infectious Disease clinic for travel consultation. Planned departure date: April 02, 2018          Planned return date: April, 2020 Countries of travel: multiple in [place] an [place] Areas in country: diving, camping   Accommodations: hotel and safari Purpose of travel: vacation Prior travel out of [place]: yes     Objective:   Medications: reviewed   Assessment:   No contraindications to travel. none     Plan:    Issues discussed: altitude illness, environmental concerns, freshwater swimming, insect-borne illnesses, Japanese encephalitis, malaria, motion sickness, MVA safety, rabies, safe food/water, traveler's diarrhea, website/handouts for more information, what to do if ill upon return, what to do if ill while there and Yellow Fever. Immunizations recommended: [email or web address] but he has refused, will verify his travel areas are not in areas of concern Malaria prophylaxis: malarone, daily dose starting 1-2 days before entering endemic area, ending 7 days after leaving area Traveler's diarrhea prophylaxis: azithromycin. Total duration of visit: 1 Hour. Total time spent on education, counseling, coordination of care: 30 Minutes. He gets occasional cellulititis and wounds secondary to his peripheral neuropathy so given a treatment course of Keflex

## 2018-03-05 NOTE — Patient Instructions (Signed)
[facility]                [address], [place]                   [place], [place] [address]                      Phone: [phone]                        Fax: [phone]   Planned departure date: January 2020          Planned return date: April 2020 Countries of travel: multiple   Guidelines for the Prevention & Treatment of Traveler's Diarrhea  Prevention: "Boil it, Peel it, Lacinda Axon it, or Forget it"   the fewer chances -> lower risk: try to stick to food & water precautions as much as possible"   If it's "piping hot"; it is probably okay, if not, it may not be   Treatment   1) You should always take care to drink lots of fluids in order to avoid dehydration   2) You should bring medications with you in case you come down with a case of diarrhea   3) OTC = bring pepto-bismol - can take with initial abdominal symptoms;                    Imodium - can help slow down your intestinal tract, can help relief cramps                    and diarrhea, can take if no bloody diarrhea  Use azithromycin if needed for traveler's diarrhea  Guidelines for the Prevention of Malaria  Avoidance:  -fewer mosquito bites = lower risk. Mosquitos can bite at night as well as daytime  -cover up (long sleeve clothing), mosquito nets, screens  -Insect repellent for your skin ( DEET containing lotion > 20%): for clothes ( permethrin spray)   2 days prior to travel, start malarone, daily dose starting 1-2 days before entering endemic area, ending 7 days after leaving area for malaria prevention.   Immunizations received today: none indicated  Future immunizations, if indicated none indicated   Prior to travel:  1) Be sure to pick up appropriate prescriptions, including medicine you take daily. Do not expect to be able to fill your prescriptions abroad.  2) Strongly consider obtaining traveler's insurance, including emergency evacuation insurance.  Most plans in the [place] do not cover participants abroad. (see below for resources)  3) Register at the appropriate [place] embassy or consulate with travel dates so they are aware of your presence in-country and for helpful advice during travel using the [employer] (STEP, GreenNylon.com.cy).  4) Leave contact information with a relative or friend.  5) Keep a Research officer, political party, credit cards in case they become lost or stolen  6) Inform your credit card company that you will be travelling abroad   During travel:  1) If you become ill and need medical advice, the [place] [employer]	[place] of the country you are traveling in general provides a list of [place] speaking doctors.  We are also available on MyChart for remote consultation if you register prior to travel. 2) Avoid motorcycles or scooters when at all possible. Traffic laws in many countries are lax and accidents occur frequently.  3) Do not take  any unnecessary risks that you wouldn't do at home.   Resources:  -Country specific information: BlindResource.ca or GreenNylon.com.cy  -Press photographer (DEET, mosquito nets): [employer], [employer] store, [employer], [place] insurance options: gatewayplans.com; http://clayton-rivera.info/; travelguard.com or [employer], gninsurance.com or info@gninsurance .com, 902-294-2597.   Post Travel:  If you return from your trip ill, call your primary care doctor or our travel clinic @ [phone].   Enjoy your trip and know that with proper pre-travel preparation, most people have an enjoyable and uninterrupted trip!

## 2018-03-05 NOTE — Telephone Encounter (Signed)
  Follow up Call-  Call back number 03/04/2018  Post procedure Call Back phone  # [phone]  Permission to leave phone message Yes  Some recent data might be hidden     Patient questions:  Do you have a fever, pain , or abdominal swelling? No. Pain Score  0 *  Have you tolerated food without any problems? Yes.    Have you been able to return to your normal activities? Yes.    Do you have any questions about your discharge instructions: Diet   No. Medications  No. Follow up visit  No.  Do you have questions or concerns about your Care? No.  Actions: * If pain score is 4 or above: No action needed, pain <4.

## 2018-03-10 ENCOUNTER — Encounter: Payer: Self-pay | Admitting: Internal Medicine

## 2018-11-16 ENCOUNTER — Ambulatory Visit (INDEPENDENT_AMBULATORY_CARE_PROVIDER_SITE_OTHER): Payer: Medicare Other

## 2018-11-16 ENCOUNTER — Other Ambulatory Visit: Payer: Self-pay

## 2018-11-16 ENCOUNTER — Encounter: Payer: Self-pay | Admitting: Podiatry

## 2018-11-16 ENCOUNTER — Ambulatory Visit (INDEPENDENT_AMBULATORY_CARE_PROVIDER_SITE_OTHER): Payer: Medicare Other | Admitting: Podiatry

## 2018-11-16 VITALS — Temp 97.7°F

## 2018-11-16 DIAGNOSIS — M722 Plantar fascial fibromatosis: Secondary | ICD-10-CM

## 2018-11-16 DIAGNOSIS — S91302A Unspecified open wound, left foot, initial encounter: Secondary | ICD-10-CM

## 2018-11-16 DIAGNOSIS — L603 Nail dystrophy: Secondary | ICD-10-CM

## 2018-11-16 MED ORDER — CEPHALEXIN 500 MG PO CAPS: 500.0000 mg | ORAL_CAPSULE | Freq: Three times a day (TID) | ORAL | 0 refills | Status: DC

## 2018-11-16 MED ORDER — MUPIROCIN 2 % EX OINT: 1.0000 "application " | TOPICAL_OINTMENT | Freq: Two times a day (BID) | CUTANEOUS | 2 refills | Status: DC
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
## 2019-08-23 ENCOUNTER — Other Ambulatory Visit: Payer: Self-pay | Admitting: Orthopedic Surgery

## 2019-08-23 DIAGNOSIS — M546 Pain in thoracic spine: Secondary | ICD-10-CM

## 2019-08-23 DIAGNOSIS — M5416 Radiculopathy, lumbar region: Secondary | ICD-10-CM

## 2019-08-30 ENCOUNTER — Telehealth: Payer: Self-pay

## 2019-08-30 NOTE — Telephone Encounter (Signed)
Patient returned my call for me to screen his medications and drug allergies before being scheduled for a myelogram.  He was informed he will be here two hours, will need a driver and will need to be on strict bedrest for 24 hours after the procedure.  He also was informed we need him to hold Effexor for 48 hours before, and 24 hours after, the myelo.

## 2019-09-08 ENCOUNTER — Other Ambulatory Visit: Payer: Self-pay | Admitting: Orthopedic Surgery

## 2019-09-08 DIAGNOSIS — M5416 Radiculopathy, lumbar region: Secondary | ICD-10-CM

## 2019-09-08 DIAGNOSIS — M546 Pain in thoracic spine: Secondary | ICD-10-CM

## 2019-09-09 ENCOUNTER — Inpatient Hospital Stay: Admission: RE | Admit: 2019-09-09 | Payer: Medicare Other | Source: Ambulatory Visit

## 2019-09-09 ENCOUNTER — Ambulatory Visit
Admission: RE | Admit: 2019-09-09 | Discharge: 2019-09-09 | Disposition: A | Discharge status: Home / Self Care | Payer: Medicare Other | Source: Ambulatory Visit | Attending: Orthopedic Surgery | Admitting: Orthopedic Surgery

## 2019-09-09 ENCOUNTER — Other Ambulatory Visit: Payer: Self-pay

## 2019-09-09 ENCOUNTER — Other Ambulatory Visit: Payer: Medicare Other

## 2019-09-09 ENCOUNTER — Other Ambulatory Visit: Payer: Self-pay | Admitting: Orthopedic Surgery

## 2019-09-09 DIAGNOSIS — M546 Pain in thoracic spine: Secondary | ICD-10-CM

## 2019-09-09 DIAGNOSIS — M5416 Radiculopathy, lumbar region: Secondary | ICD-10-CM

## 2019-09-09 MED ORDER — IOPAMIDOL (ISOVUE-M 300) INJECTION 61%
10.0000 mL | Freq: Once | INTRAMUSCULAR | Status: AC | PRN
  Administered 2019-09-09: 10 mL via INTRATHECAL

## 2019-09-09 MED ORDER — DIAZEPAM 5 MG PO TABS: 5.0000 mg | ORAL_TABLET | Freq: Once | ORAL | Status: DC

## 2019-09-09 NOTE — Discharge Instructions (Signed)
Myelogram Discharge Instructions  1. Go home and rest quietly for the next 24 hours.  It is important to lie flat for the next 24 hours.  Get up only to go to the restroom.  You may lie in the bed or on a couch on your back, your stomach, your left side or your right side.  You may have one pillow under your head.  You may have pillows between your knees while you are on your side or under your knees while you are on your back.  2. DO NOT drive today.  Recline the seat as far back as it will go, while still wearing your seat belt, on the way home.  3. You may get up to go to the bathroom as needed.  You may sit up for 10 minutes to eat.  You may resume your normal diet and medications unless otherwise indicated.  Drink lots of extra fluids today and tomorrow.  4. The incidence of headache, nausea, or vomiting is about 5% (one in 20 patients).  If you develop a headache, lie flat and drink plenty of fluids until the headache goes away.  Caffeinated beverages may be helpful.  If you develop severe nausea and vomiting or a headache that does not go away with flat bed rest, call [phone].  5. You may resume normal activities after your 24 hours of bed rest is over; however, do not exert yourself strongly or do any heavy lifting tomorrow. If when you get up you have a headache when standing, go back to bed and force fluids for another 24 hours.  6. Call your physician for a follow-up appointment.  The results of your myelogram will be sent directly to your physician by the following day.  7. If you have any questions or if complications develop after you arrive home, please call [phone].  Discharge instructions have been explained to the patient.  The patient, or the person responsible for the patient, fully understands these instructions.  YOU MAY RESTART YOUR [facility] TOMORROW 09/10/2019 AT 10:30AM.

## 2019-09-14 ENCOUNTER — Ambulatory Visit: Payer: Medicare Other | Admitting: Internal Medicine

## 2019-09-14 ENCOUNTER — Encounter: Payer: Self-pay | Admitting: Internal Medicine

## 2019-09-14 VITALS — BP 117/79 | HR 77 | Ht 71.0 in | Wt 160.6 lb

## 2019-09-14 DIAGNOSIS — Z8719 Personal history of other diseases of the digestive system: Secondary | ICD-10-CM | POA: Diagnosis not present

## 2019-09-14 DIAGNOSIS — Z86010 Personal history of colonic polyps: Secondary | ICD-10-CM

## 2019-09-14 DIAGNOSIS — K648 Other hemorrhoids: Secondary | ICD-10-CM | POA: Diagnosis not present

## 2019-09-14 NOTE — Progress Notes (Signed)
Subjective:    Patient ID: [name], male    DOB: 11/12/1960, 59 y.o.   MRN: [id number]  HPI [name] is a 59 year old male known to me from surveillance colonoscopy for personal history of adenomatous colon polyps who is seen to evaluate symptomatic internal hemorrhoids.  He is here alone today.  His last colonoscopy was on 03/04/2018.  This was a complete colonoscopy with good preparation.  2 polyps were removed one from the cecum, the other from the sigmoid the largest being 6 mm.  There were medium-sized internal hemorrhoids found.  These polyps were adenomatous without high-grade dysplasia.  He reports that he has had symptomatic bleeding internal hemorrhoids for quite some time.  He is tried over-the-counter hemorrhoidal medications which are not helpful.  They flare when he goes for walks.  He had a spinal cord injury 20 years ago and he tries to walk 2 or 3 days/week.  With this he will have bleeding and blood will be present in his bowel movement.  His bowel habits are regular and have not changed.  No diarrhea or constipation.  He has bowel movement every morning.  He does occasionally have some abdominal cramping and bloating symptom.  His hemorrhoids prolapse and he can reduce this tissue after bowel movement.  He has reflux disease which is well controlled with omeprazole 20 mg daily.  He does take oral iron for the last 5 years as well as a B12 supplement   Review of Systems As per HPI, otherwise negative  Current Medications, Allergies, Past Medical History, Past Surgical History, Family History and Social History were reviewed in [employer] record.     Objective:   Physical Exam BP 117/79   Pulse 77   Ht 5\' 11"  (1.803 m)   Wt 160 lb 9.6 oz (72.8 kg)   SpO2 97%   BMI 22.40 kg/m  Gen: awake, alert, NAD HEENT: anicteric Abd: soft, NT/ND, +BS throughout Rectal: no external lesions ANOSCOPY: Using a disposable, lubricated, slotted,  self-illuminating anoscope, the rectum was intubated without difficulty. The trochar was removed and the ano-rectum was circumferentially inspected. There were internal hemorrhoids, RA and LL>RP. There was no finding of an anorectal fissure. The rectal mucosa was not inflamed. No neoplasia or other pathology was identified. The inspection was well tolerated.  Ext: no c/c/e Neuro: nonfocal      Assessment & Plan:  59 year old male known to me from surveillance colonoscopy for personal history of adenomatous colon polyps who is seen to evaluate symptomatic internal hemorrhoids.  1.  Symptomatic internal hemorrhoids with prolapse and bleeding --we spent time today discussing internal hemorrhoids, pathophysiology and treatment options.  We discussed how medical management rarely leads to prolonged results which she has experienced.  We discussed hemorrhoidal banding but also surgical hemorrhoidectomy.  After this discussion he prefers to proceed with hemorrhoidal banding.  He is not having diarrhea or constipation which would exacerbate hemorrhoidal disease.   PROCEDURE NOTE:  The patient presents with symptomatic grade 3 internal hemorrhoids, requesting rubber band ligation of his hemorrhoidal disease.  All risks, benefits and alternative forms of therapy were described and informed consent was obtained.   The anorectum was pre-medicated with 0.125% nitroglycerin ointment The decision was made to band the LL internal hemorrhoid, and the [place] was used to perform band ligation without complication.   Digital anorectal examination was then performed to assure proper positioning of the band, and to adjust the banded tissue as required.  The patient was discharged home without pain or other issues.  Dietary and behavioral recommendations were given and along with follow-up instructions.     The patient will return as scheduled for  follow-up and possible additional banding as  required. No complications were encountered and the patient tolerated the procedure well.  2.  GERD --stable without alarm symptoms.  He will continue his omeprazole 20 mg daily  3.  History of adenomatous colon polyps --surveillance colonoscopy would be December 2024

## 2019-09-14 NOTE — Patient Instructions (Signed)

## 2019-09-23 ENCOUNTER — Encounter: Payer: Self-pay | Admitting: Physical Medicine and Rehabilitation

## 2019-09-27 ENCOUNTER — Encounter: Payer: Self-pay | Admitting: Family Medicine

## 2019-09-27 ENCOUNTER — Other Ambulatory Visit: Payer: Self-pay

## 2019-09-27 ENCOUNTER — Ambulatory Visit (INDEPENDENT_AMBULATORY_CARE_PROVIDER_SITE_OTHER): Payer: Medicare Other | Admitting: Family Medicine

## 2019-09-27 VITALS — BP 110/68 | HR 62 | Temp 97.6°F | Ht 70.0 in | Wt 164.0 lb

## 2019-09-27 DIAGNOSIS — G609 Hereditary and idiopathic neuropathy, unspecified: Secondary | ICD-10-CM

## 2019-09-27 DIAGNOSIS — E538 Deficiency of other specified B group vitamins: Secondary | ICD-10-CM | POA: Diagnosis not present

## 2019-09-27 DIAGNOSIS — E611 Iron deficiency: Secondary | ICD-10-CM

## 2019-09-27 DIAGNOSIS — Z23 Encounter for immunization: Secondary | ICD-10-CM | POA: Diagnosis not present

## 2019-09-27 DIAGNOSIS — Z87828 Personal history of other (healed) physical injury and trauma: Secondary | ICD-10-CM | POA: Diagnosis not present

## 2019-09-27 DIAGNOSIS — M62561 Muscle wasting and atrophy, not elsewhere classified, right lower leg: Secondary | ICD-10-CM | POA: Insufficient documentation

## 2019-09-27 DIAGNOSIS — S80812A Abrasion, left lower leg, initial encounter: Secondary | ICD-10-CM | POA: Insufficient documentation

## 2019-09-27 DIAGNOSIS — Z0000 Encounter for general adult medical examination without abnormal findings: Secondary | ICD-10-CM | POA: Diagnosis not present

## 2019-09-27 LAB — URINALYSIS, ROUTINE W REFLEX MICROSCOPIC
Hgb urine dipstick: NEGATIVE
Ketones, ur: NEGATIVE
Leukocytes,Ua: NEGATIVE
Nitrite: NEGATIVE
Specific Gravity, Urine: 1.025 (ref 1.000–1.030)
Total Protein, Urine: NEGATIVE
Urine Glucose: NEGATIVE
Urobilinogen, UA: 0.2 (ref 0.0–1.0)
pH: 5.5 (ref 5.0–8.0)

## 2019-09-27 LAB — COMPREHENSIVE METABOLIC PANEL
ALT: 13 U/L (ref 0–53)
AST: 13 U/L (ref 0–37)
Albumin: 4.3 g/dL (ref 3.5–5.2)
Alkaline Phosphatase: 94 U/L (ref 39–117)
BUN: 12 mg/dL (ref 6–23)
CO2: 31 mEq/L (ref 19–32)
Calcium: 9.2 mg/dL (ref 8.4–10.5)
Chloride: 103 mEq/L (ref 96–112)
Creatinine, Ser: 0.74 mg/dL (ref 0.40–1.50)
GFR: 109.04 mL/min (ref >60.00)
Glucose, Bld: 90 mg/dL (ref 70–99)
Potassium: 4.2 mEq/L (ref 3.5–5.1)
Sodium: 139 mEq/L (ref 135–145)
Total Bilirubin: 0.7 mg/dL (ref 0.2–1.2)
Total Protein: 6.4 g/dL (ref 6.0–8.3)

## 2019-09-27 LAB — LIPID PANEL
Cholesterol: 157 mg/dL (ref 0–200)
HDL: 66.5 mg/dL (ref >39.00)
LDL Cholesterol: 75 mg/dL (ref 0–99)
NonHDL: 90.23
Total CHOL/HDL Ratio: 2
Triglycerides: 74 mg/dL (ref 0.0–149.0)
VLDL: 14.8 mg/dL (ref 0.0–40.0)

## 2019-09-27 LAB — CBC
HCT: 42 % (ref 39.0–52.0)
Hemoglobin: 14.3 g/dL (ref 13.0–17.0)
MCHC: 34.1 g/dL (ref 30.0–36.0)
MCV: 88.4 fl (ref 78.0–100.0)
Platelets: 238 10*3/uL (ref 150.0–400.0)
RBC: 4.75 Mil/uL (ref 4.22–5.81)
RDW: 12.9 % (ref 11.5–15.5)
WBC: 3.9 10*3/uL — ABNORMAL LOW (ref 4.0–10.5)

## 2019-09-27 LAB — LDL CHOLESTEROL, DIRECT: Direct LDL: 71 mg/dL

## 2019-09-27 LAB — VITAMIN B12: Vitamin B-12: 776 pg/mL (ref 211–911)

## 2019-09-27 LAB — PSA: PSA: 0.97 ng/mL (ref 0.10–4.00)

## 2019-09-27 NOTE — Patient Instructions (Signed)
Health Maintenance, Male Adopting a healthy lifestyle and getting preventive care are important in promoting health and wellness. Ask your health care provider about:  The right schedule for you to have regular tests and exams.  Things you can do on your own to prevent diseases and keep yourself healthy. What should I know about diet, weight, and exercise? Eat a healthy diet   Eat a diet that includes plenty of vegetables, fruits, low-fat dairy products, and lean protein.  Do not eat a lot of foods that are high in solid fats, added sugars, or sodium. Maintain a healthy weight Body mass index (BMI) is a measurement that can be used to identify possible weight problems. It estimates body fat based on height and weight. Your health care provider can help determine your BMI and help you achieve or maintain a healthy weight. Get regular exercise Get regular exercise. This is one of the most important things you can do for your health. Most adults should:  Exercise for at least 150 minutes each week. The exercise should increase your heart rate and make you sweat (moderate-intensity exercise).  Do strengthening exercises at least twice a week. This is in addition to the moderate-intensity exercise.  Spend less time sitting. Even light physical activity can be beneficial. Watch cholesterol and blood lipids Have your blood tested for lipids and cholesterol at 57 years of age, then have this test every 5 years. You may need to have your cholesterol levels checked more often if:  Your lipid or cholesterol levels are high.  You are older than 57 years of age.  You are at high risk for heart disease. What should I know about cancer screening? Many types of cancers can be detected early and may often be prevented. Depending on your health history and family history, you may need to have cancer screening at various ages. This may include screening for:  Colorectal cancer.  Prostate  cancer.  Skin cancer.  Lung cancer. What should I know about heart disease, diabetes, and high blood pressure? Blood pressure and heart disease  High blood pressure causes heart disease and increases the risk of stroke. This is more likely to develop in people who have high blood pressure readings, are of African descent, or are overweight.  Talk with your health care provider about your target blood pressure readings.  Have your blood pressure checked: ? Every 3-5 years if you are 18-39 years of age. ? Every year if you are 40 years old or older.  If you are between the ages of 65 and 75 and are a current or former smoker, ask your health care provider if you should have a one-time screening for abdominal aortic aneurysm (AAA). Diabetes Have regular diabetes screenings. This checks your fasting blood sugar level. Have the screening done:  Once every three years after age 45 if you are at a normal weight and have a low risk for diabetes.  More often and at a younger age if you are overweight or have a high risk for diabetes. What should I know about preventing infection? Hepatitis B If you have a higher risk for hepatitis B, you should be screened for this virus. Talk with your health care provider to find out if you are at risk for hepatitis B infection. Hepatitis C Blood testing is recommended for:  Everyone born from 1945 through 1965.  Anyone with known risk factors for hepatitis C. Sexually transmitted infections (STIs)  You should be screened each year   for STIs, including gonorrhea and chlamydia, if: ? You are sexually active and are younger than 57 years of age. ? You are older than 57 years of age and your health care provider tells you that you are at risk for this type of infection. ? Your sexual activity has changed since you were last screened, and you are at increased risk for chlamydia or gonorrhea. Ask your health care provider if you are at risk.  Ask your  health care provider about whether you are at high risk for HIV. Your health care provider may recommend a prescription medicine to help prevent HIV infection. If you choose to take medicine to prevent HIV, you should first get tested for HIV. You should then be tested every 3 months for as long as you are taking the medicine. Follow these instructions at home: Lifestyle  Do not use any products that contain nicotine or tobacco, such as cigarettes, e-cigarettes, and chewing tobacco. If you need help quitting, ask your health care provider.  Do not use street drugs.  Do not share needles.  Ask your health care provider for help if you need support or information about quitting drugs. Alcohol use  Do not drink alcohol if your health care provider tells you not to drink.  If you drink alcohol: ? Limit how much you have to 0-2 drinks a day. ? Be aware of how much alcohol is in your drink. In the [place] one drink equals one 12 oz bottle of beer (355 mL), one 5 oz glass of wine (148 mL), or one 1 oz glass of hard liquor (44 mL). General instructions  Schedule regular health, dental, and eye exams.  Stay current with your vaccines.  Tell your health care provider if: ? You often feel depressed. ? You have ever been abused or do not feel safe at home. Summary  Adopting a healthy lifestyle and getting preventive care are important in promoting health and wellness.  Follow your health care provider's instructions about healthy diet, exercising, and getting tested or screened for diseases.  Follow your health care provider's instructions on monitoring your cholesterol and blood pressure. This information is not intended to replace advice given to you by your health care provider. Make sure you discuss any questions you have with your health care provider. Document Revised: 03/10/2018 Document Reviewed: 03/10/2018 Elsevier Patient Education  2020 Elsevier Inc.  Preventive Care 40-64 Years  Old, Male Preventive care refers to lifestyle choices and visits with your health care provider that can promote health and wellness. This includes:  A yearly physical exam. This is also called an annual well check.  Regular dental and eye exams.  Immunizations.  Screening for certain conditions.  Healthy lifestyle choices, such as eating a healthy diet, getting regular exercise, not using drugs or products that contain nicotine and tobacco, and limiting alcohol use. What can I expect for my preventive care visit? Physical exam Your health care provider will check:  Height and weight. These may be used to calculate body mass index (BMI), which is a measurement that tells if you are at a healthy weight.  Heart rate and blood pressure.  Your skin for abnormal spots. Counseling Your health care provider may ask you questions about:  Alcohol, tobacco, and drug use.  Emotional well-being.  Home and relationship well-being.  Sexual activity.  Eating habits.  Work and work environment. What immunizations do I need?  Influenza (flu) vaccine  This is recommended every year. Tetanus, diphtheria,   and pertussis (Tdap) vaccine  You may need a Td booster every 10 years. Varicella (chickenpox) vaccine  You may need this vaccine if you have not already been vaccinated. Zoster (shingles) vaccine  You may need this after age 63. Measles, mumps, and rubella (MMR) vaccine  You may need at least one dose of MMR if you were born in 1957 or later. You may also need a second dose. Pneumococcal conjugate (PCV13) vaccine  You may need this if you have certain conditions and were not previously vaccinated. Pneumococcal polysaccharide (PPSV23) vaccine  You may need one or two doses if you smoke cigarettes or if you have certain conditions. Meningococcal conjugate (MenACWY) vaccine  You may need this if you have certain conditions. Hepatitis A vaccine  You may need this if you have  certain conditions or if you travel or work in places where you may be exposed to hepatitis A. Hepatitis B vaccine  You may need this if you have certain conditions or if you travel or work in places where you may be exposed to hepatitis B. Haemophilus influenzae type b (Hib) vaccine  You may need this if you have certain risk factors. Human papillomavirus (HPV) vaccine  If recommended by your health care provider, you may need three doses over 6 months. You may receive vaccines as individual doses or as more than one vaccine together in one shot (combination vaccines). Talk with your health care provider about the risks and benefits of combination vaccines. What tests do I need? Blood tests  Lipid and cholesterol levels. These may be checked every 5 years, or more frequently if you are over 68 years old.  Hepatitis C test.  Hepatitis B test. Screening  Lung cancer screening. You may have this screening every year starting at age 78 if you have a 30-pack-year history of smoking and currently smoke or have quit within the past 15 years.  Prostate cancer screening. Recommendations will vary depending on your family history and other risks.  Colorectal cancer screening. All adults should have this screening starting at age 38 and continuing until age 22. Your health care provider may recommend screening at age 73 if you are at increased risk. You will have tests every 1-10 years, depending on your results and the type of screening test.  Diabetes screening. This is done by checking your blood sugar (glucose) after you have not eaten for a while (fasting). You may have this done every 1-3 years.  Sexually transmitted disease (STD) testing. Follow these instructions at home: Eating and drinking  Eat a diet that includes fresh fruits and vegetables, whole grains, lean protein, and low-fat dairy products.  Take vitamin and mineral supplements as recommended by your health care  provider.  Do not drink alcohol if your health care provider tells you not to drink.  If you drink alcohol: ? Limit how much you have to 0-2 drinks a day. ? Be aware of how much alcohol is in your drink. In the [place] one drink equals one 12 oz bottle of beer (355 mL), one 5 oz glass of wine (148 mL), or one 1 oz glass of hard liquor (44 mL). Lifestyle  Take daily care of your teeth and gums.  Stay active. Exercise for at least 30 minutes on 5 or more days each week.  Do not use any products that contain nicotine or tobacco, such as cigarettes, e-cigarettes, and chewing tobacco. If you need help quitting, ask your health care provider.  If  you are sexually active, practice safe sex. Use a condom or other form of protection to prevent STIs (sexually transmitted infections).  Talk with your health care provider about taking a low-dose aspirin every day starting at age 50. What's next?  Go to your health care provider once a year for a well check visit.  Ask your health care provider how often you should have your eyes and teeth checked.  Stay up to date on all vaccines. This information is not intended to replace advice given to you by your health care provider. Make sure you discuss any questions you have with your health care provider. Document Revised: 03/11/2018 Document Reviewed: 03/11/2018 Elsevier Patient Education  2020 Elsevier Inc.  
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
## 2019-09-28 LAB — IRON,TIBC AND FERRITIN PANEL
%SAT: 42 % (calc) (ref 20–48)
Ferritin: 61 ng/mL (ref 38–380)
Iron: 119 ug/dL (ref 50–180)
TIBC: 286 mcg/dL (calc) (ref 250–425)

## 2019-09-28 LAB — HEPATITIS C ANTIBODY
Hepatitis C Ab: NONREACTIVE
SIGNAL TO CUT-OFF: 0.05 (ref <1.00)

## 2019-09-28 LAB — HIV ANTIBODY (ROUTINE TESTING W REFLEX): HIV 1&2 Ab, 4th Generation: NONREACTIVE

## 2019-09-29 ENCOUNTER — Telehealth: Payer: Self-pay | Admitting: Family Medicine

## 2019-09-29 NOTE — Telephone Encounter (Signed)
Patient is returning a call to the office. He is also requesting a copy of his last office visit. Please give him a call back at [phone] .   Thank you, [name]

## 2019-10-06 ENCOUNTER — Telehealth: Payer: Self-pay | Admitting: Family Medicine

## 2019-10-06 NOTE — Telephone Encounter (Signed)
Okay to refer? 

## 2019-10-06 NOTE — Telephone Encounter (Signed)
Patient calling for referral to [facility] to evaluate neuropathy okay to refer? Please advise.

## 2019-10-06 NOTE — Telephone Encounter (Signed)
Patient is requesting referral to [place], Phone [phone] for EMG due to neuropathy.

## 2019-10-07 NOTE — Addendum Note (Signed)
Addended by: Lynda Rainwater on: 10/07/2019 08:59 AM   Modules accepted: Orders

## 2019-10-10 ENCOUNTER — Telehealth: Payer: Self-pay | Admitting: Family Medicine

## 2019-10-10 NOTE — Telephone Encounter (Signed)
Patient is calling to see if he can get a referral to Neurology for spinal cord injury. He states that is needs to be sent to [facility]. Please call him back at [phone] if you have any questions.  Thank you, [name]

## 2019-10-11 ENCOUNTER — Encounter: Payer: Self-pay | Admitting: Physical Medicine and Rehabilitation

## 2019-10-11 ENCOUNTER — Encounter
Payer: Medicare Other | Attending: Physical Medicine and Rehabilitation | Admitting: Physical Medicine and Rehabilitation

## 2019-10-11 ENCOUNTER — Other Ambulatory Visit: Payer: Self-pay

## 2019-10-11 VITALS — BP 121/78 | HR 55 | Temp 98.1°F | Ht 71.0 in | Wt 164.0 lb

## 2019-10-11 DIAGNOSIS — Z87828 Personal history of other (healed) physical injury and trauma: Secondary | ICD-10-CM | POA: Diagnosis not present

## 2019-10-11 DIAGNOSIS — G039 Meningitis, unspecified: Secondary | ICD-10-CM | POA: Insufficient documentation

## 2019-10-11 DIAGNOSIS — S80812A Abrasion, left lower leg, initial encounter: Secondary | ICD-10-CM | POA: Diagnosis not present

## 2019-10-11 DIAGNOSIS — S32008S Other fracture of unspecified lumbar vertebra, sequela: Secondary | ICD-10-CM | POA: Diagnosis present

## 2019-10-11 DIAGNOSIS — M62561 Muscle wasting and atrophy, not elsewhere classified, right lower leg: Secondary | ICD-10-CM | POA: Insufficient documentation

## 2019-10-11 DIAGNOSIS — R208 Other disturbances of skin sensation: Secondary | ICD-10-CM | POA: Diagnosis present

## 2019-10-11 DIAGNOSIS — S34109S Unspecified injury to unspecified level of lumbar spinal cord, sequela: Secondary | ICD-10-CM | POA: Diagnosis not present

## 2019-10-11 MED ORDER — AMITRIPTYLINE HCL 10 MG PO TABS: 10.0000 mg | ORAL_TABLET | Freq: Every day | ORAL | 3 refills | Status: DC
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
## 2019-10-11 NOTE — Telephone Encounter (Signed)
From the note I am not sure what is needed. It looks like both referrals are set?

## 2019-10-12 ENCOUNTER — Encounter: Payer: Self-pay | Admitting: Physical Medicine and Rehabilitation

## 2019-10-13 NOTE — Addendum Note (Signed)
Addended by: Lynda Rainwater on: 10/13/2019 10:32 AM   Modules accepted: Orders

## 2019-10-18 NOTE — Telephone Encounter (Signed)
Done

## 2019-10-19 ENCOUNTER — Ambulatory Visit: Payer: Medicare Other | Admitting: Physician Assistant

## 2019-10-24 ENCOUNTER — Encounter (HOSPITAL_BASED_OUTPATIENT_CLINIC_OR_DEPARTMENT_OTHER): Payer: Medicare Other | Admitting: Physical Medicine & Rehabilitation

## 2019-10-24 ENCOUNTER — Other Ambulatory Visit: Payer: Self-pay

## 2019-10-24 ENCOUNTER — Encounter: Payer: Self-pay | Admitting: Physical Medicine & Rehabilitation

## 2019-10-24 VITALS — BP 114/78 | HR 69 | Temp 97.8°F | Ht 71.0 in | Wt 164.4 lb

## 2019-10-24 DIAGNOSIS — S34109S Unspecified injury to unspecified level of lumbar spinal cord, sequela: Secondary | ICD-10-CM | POA: Diagnosis not present

## 2019-10-24 DIAGNOSIS — R208 Other disturbances of skin sensation: Secondary | ICD-10-CM | POA: Diagnosis not present

## 2019-10-24 NOTE — Progress Notes (Signed)
Please see media tab for full results 

## 2019-10-31 ENCOUNTER — Ambulatory Visit (INDEPENDENT_AMBULATORY_CARE_PROVIDER_SITE_OTHER): Payer: Medicare Other | Admitting: Internal Medicine

## 2019-10-31 ENCOUNTER — Encounter: Payer: Self-pay | Admitting: Internal Medicine

## 2019-10-31 VITALS — BP 102/70 | HR 76 | Ht 71.0 in | Wt 163.0 lb

## 2019-10-31 DIAGNOSIS — K648 Other hemorrhoids: Secondary | ICD-10-CM

## 2019-10-31 NOTE — Patient Instructions (Addendum)
If you are age 57 or older, your body mass index should be between 23-30. Your Body mass index is 22.73 kg/m. If this is out of the aforementioned range listed, please consider follow up with your Primary Care Provider.  If you are age 17 or younger, your body mass index should be between 19-25. Your Body mass index is 22.73 kg/m. If this is out of the aformentioned range listed, please consider follow up with your Primary Care Provider.   HEMORRHOID FOLLOW-UP CARE  1. Avoid strenuous exercise, lifting >30-40lbs.     2. A sitz bath (soaking in a warm tub) or bidet is soothing, and can be useful for cleansing the area after bowel movements.    3. To avoid constipation, take two tablespoons of natural wheat bran, natural oat bran, flax, Benefiber or any over the counter fiber supplement and increase your water intake to 7-8 glasses daily.    4. Unless you have been prescribed anorectal medication, do not put anything inside your rectum for two weeks: No suppositories, enemas, fingers, etc.  5. Do not stay seated continuously for more than 2-3 hours for a day or two after the procedure.  Tighten your buttock muscles 10-15 times every two hours and take 10-15 deep breaths every 1-2 hours.  Do not spend more than a few minutes on the toilet if you cannot empty your bowel; instead re-visit the toilet at a later time.   You are scheduled for 3rd banding on 12-07-19 at 3:20pm.  Thank you for entrusting me with your care and choosing [facility].  Dr Hilarie Fredrickson

## 2019-10-31 NOTE — Progress Notes (Signed)
[name] is a 59 year old male with a history of symptomatic internal hemorrhoids who returns for additional hemorrhoidal banding.  He was last seen on 09/14/2019 where we performed hemorrhoidal banding to the left lateral internal hemorrhoid  Symptoms prior to rubber band therapy were hemorrhoidal bleeding and prolapse  He reports no significant change in his bleeding or prolapse symptoms after first banding though he tolerated this well   PROCEDURE NOTE:  The patient presents with symptomatic grade 3 internal hemorrhoids, requesting rubber band ligation of his hemorrhoidal disease.  All risks, benefits and alternative forms of therapy were described and informed consent was obtained.   The anorectum was pre-medicated with 0.125% nitroglycerin ointment The decision was made to band the RA (left lateral banded on 09/14/2019) internal hemorrhoid, and the [place] was used to perform band ligation without complication.   Digital anorectal examination was then performed to assure proper positioning of the band, and to adjust the banded tissue as required.  The patient was discharged home without pain or other issues.  Dietary and behavioral recommendations were given and along with follow-up instructions.     The patient will return as scheduled for follow-up and possible additional banding as required. No complications were encountered and the patient tolerated the procedure well.

## 2019-11-14 ENCOUNTER — Ambulatory Visit: Payer: Medicare Other | Admitting: Podiatry

## 2019-11-15 ENCOUNTER — Telehealth: Payer: Self-pay

## 2019-11-15 NOTE — Telephone Encounter (Signed)
Spoke with patient to try to schedule Annual Medicare Wellness exam. He states he will be out of town until late September & will call us back at that time to schedule.

## 2019-11-16 ENCOUNTER — Ambulatory Visit: Payer: Medicare Other | Admitting: Physical Medicine and Rehabilitation

## 2019-12-07 ENCOUNTER — Ambulatory Visit: Payer: Medicare Other | Admitting: Internal Medicine

## 2019-12-14 ENCOUNTER — Ambulatory Visit: Payer: Medicare Other | Admitting: Neurology

## 2019-12-15 ENCOUNTER — Other Ambulatory Visit: Payer: Self-pay

## 2019-12-15 ENCOUNTER — Ambulatory Visit (INDEPENDENT_AMBULATORY_CARE_PROVIDER_SITE_OTHER): Payer: Medicare Other | Admitting: Podiatry

## 2019-12-15 DIAGNOSIS — M779 Enthesopathy, unspecified: Secondary | ICD-10-CM

## 2019-12-15 DIAGNOSIS — M722 Plantar fascial fibromatosis: Secondary | ICD-10-CM | POA: Diagnosis not present

## 2019-12-16 ENCOUNTER — Ambulatory Visit: Payer: Medicare Other | Admitting: Physical Medicine and Rehabilitation

## 2019-12-18 NOTE — Progress Notes (Signed)
Subjective: 59 year old male presents the office today for concerns of left foot pain in the arch of the foot.  He is interested in new orthotics.  He previously saw a doctor in [place] where MRI was performed.  He has been on oral steroids as well as anti-inflammatories do not see any improvement with this.  He points to the dorsal medial aspect of the left leg is majority discomfort.  Denies any recent injury or falls.  He also has pain in the arch of his left foot.  He has no sensation in his right foot. Denies any systemic complaints such as fevers, chills, nausea, vomiting. No acute changes since last appointment, and no other complaints at this time.   Objective: AAO x3, NAD DP/PT pulses palpable bilaterally, CRT less than 3 seconds Sensation absent on the right side.  On the right side there is rigid cavus deformity with pes varus present.  This is been ongoing since spinal cord injury.  On the left foot there is a decrease in medial arch upon weightbearing.  The majority of tenderness is along the insertion of the tibialis anterior tendon but the tendon appears to be intact.  Mild discomfort on the medial ankle plantar fashion the arch of the foot as well.  Plantar fascial appears to be intact.  MMT 5/5 on the left. No pain with calf compression, swelling, warmth, erythema  Assessment: 59 year old male with tibialis anterior tendinitis, plantar fasciitis  Plan: -All treatment options discussed with the patient including all alternatives, risks, complications.  -I reviewed the MRI that he had done previously which did reveal moderate to severe tendinopathy of the anterior tibial tendon without tear. -At this point I recommend new custom orthotics and I will have him follow up with Liliane Channel for molding of the orthotics -Referral placed for [facility] -For now continue orthotics he has as well as wearing supportive shoes.  He can use ankle brace on the left side if needed. -Patient  encouraged to call the office with any questions, concerns, change in symptoms.

## 2019-12-20 ENCOUNTER — Encounter: Payer: Self-pay | Admitting: Internal Medicine

## 2019-12-20 ENCOUNTER — Ambulatory Visit (INDEPENDENT_AMBULATORY_CARE_PROVIDER_SITE_OTHER): Payer: Medicare Other | Admitting: Internal Medicine

## 2019-12-20 VITALS — BP 110/78 | HR 54 | Ht 71.0 in | Wt 163.5 lb

## 2019-12-20 DIAGNOSIS — K648 Other hemorrhoids: Secondary | ICD-10-CM | POA: Diagnosis not present

## 2019-12-20 NOTE — Progress Notes (Signed)
[name] is a 59 year old male with a history of symptomatic internal hemorrhoids who returns for additional hemorrhoidal banding  He was seen for hemorrhoidal banding on 09/14/2019 and 10/31/2019.  Symptoms prior to band therapy were intermittent hemorrhoidal bleeding and prolapse symptoms.  Symptoms are worse when he is active particularly if he exercises by walking.  Does not typically bleed with bowel movement.  Bowel habits are regular  To this point he has not noticed significant improvement in hemorrhoidal symptoms with banding appointment #1 or #2   PROCEDURE NOTE:  The patient presents with symptomatic grade 3 internal hemorrhoids, requesting rubber band ligation of his hemorrhoidal disease.  All risks, benefits and alternative forms of therapy were described and informed consent was obtained.   The anorectum was pre-medicated with 0.125% nitroglycerin ointment The decision was made to band the right posterior (left lateral and right anterior banded previously) internal hemorrhoid, and the [place] was used to perform band ligation without complication.   Digital anorectal examination was then performed to assure proper positioning of the band, and to adjust the banded tissue as required.  The patient was discharged home without pain or other issues.  Dietary and behavioral recommendations were given and along with follow-up instructions.     The following adjunctive treatments were recommended: I asked that he call me in 1 month let me know if hemorrhoidal symptoms have resolved.  If not consider surgical referral for hemorrhoidectomy as he will have failed to improve with banding x 3.   No complications were encountered and the patient tolerated the procedure well.

## 2019-12-20 NOTE — Patient Instructions (Signed)
Please let us know how your rectal bleeding is in 1 month.  If you are age 57 or older, your body mass index should be between 23-30. Your Body mass index is 22.8 kg/m. If this is out of the aforementioned range listed, please consider follow up with your Primary Care Provider.  If you are age 35 or younger, your body mass index should be between 19-25. Your Body mass index is 22.8 kg/m. If this is out of the aformentioned range listed, please consider follow up with your Primary Care Provider.   Due to recent changes in healthcare laws, you may see the results of your imaging and laboratory studies on MyChart before your provider has had a chance to review them.  We understand that in some cases there may be results that are confusing or concerning to you. Not all laboratory results come back in the same time frame and the provider may be waiting for multiple results in order to interpret others.  Please give [place] 48 hours in order for your provider to thoroughly review all the results before contacting the office for clarification of your results.   HEMORRHOID BANDING PROCEDURE    FOLLOW-UP CARE   1. The procedure you have had should have been relatively painless since the banding of the area involved does not have nerve endings and there is no pain sensation.  The rubber band cuts off the blood supply to the hemorrhoid and the band may fall off as soon as 48 hours after the banding (the band may occasionally be seen in the toilet bowl following a bowel movement). You may notice a temporary feeling of fullness in the rectum which should respond adequately to plain Tylenol or Motrin.  2. Following the banding, avoid strenuous exercise that evening and resume full activity the next day.  A sitz bath (soaking in a warm tub) or bidet is soothing, and can be useful for cleansing the area after bowel movements.     3. To avoid constipation, take two tablespoons of natural wheat bran, natural oat  bran, flax, Benefiber or any over the counter fiber supplement and increase your water intake to 7-8 glasses daily.    4. Unless you have been prescribed anorectal medication, do not put anything inside your rectum for two weeks: No suppositories, enemas, fingers, etc.  5. Occasionally, you may have more bleeding than usual after the banding procedure.  This is often from the untreated hemorrhoids rather than the treated one.  Don't be concerned if there is a tablespoon or so of blood.  If there is more blood than this, lie flat with your bottom higher than your head and apply an ice pack to the area. If the bleeding does not stop within a half an hour or if you feel faint, call our office at [phone] or go to the emergency room.  6. Problems are not common; however, if there is a substantial amount of bleeding, severe pain, chills, fever or difficulty passing urine (very rare) or other problems, you should call us at [phone] or report to the nearest emergency room.  7. Do not stay seated continuously for more than 2-3 hours for a day or two after the procedure.  Tighten your buttock muscles 10-15 times every two hours and take 10-15 deep breaths every 1-2 hours.  Do not spend more than a few minutes on the toilet if you cannot empty your bowel; instead re-visit the toilet at a later time.

## 2019-12-21 ENCOUNTER — Other Ambulatory Visit: Payer: Self-pay

## 2019-12-21 ENCOUNTER — Ambulatory Visit: Payer: Medicare Other | Admitting: Orthotics

## 2019-12-21 DIAGNOSIS — M779 Enthesopathy, unspecified: Secondary | ICD-10-CM

## 2019-12-21 DIAGNOSIS — M722 Plantar fascial fibromatosis: Secondary | ICD-10-CM

## 2019-12-21 NOTE — Progress Notes (Signed)
Patient doesn't want to spend $$ on new f/o at this time; we talked about refurbishing his old f/o with scaphoid pads and valgus RF/FF wedges 5* to take pressure off cavus arch, support mid foot structure; help AT heal.

## 2019-12-22 ENCOUNTER — Other Ambulatory Visit: Payer: Self-pay | Admitting: Podiatry

## 2019-12-22 NOTE — Progress Notes (Signed)
error 

## 2019-12-23 ENCOUNTER — Encounter: Payer: Self-pay | Admitting: Podiatry

## 2019-12-23 NOTE — Progress Notes (Signed)
I have sent dexamethasone to [facility] at the request of PT for iontophoresis.   Left VM for patient to call back

## 2020-01-31 ENCOUNTER — Ambulatory Visit: Payer: Medicare Other | Admitting: Orthotics

## 2020-01-31 ENCOUNTER — Other Ambulatory Visit: Payer: Self-pay

## 2020-01-31 DIAGNOSIS — M722 Plantar fascial fibromatosis: Secondary | ICD-10-CM

## 2020-01-31 DIAGNOSIS — S91302A Unspecified open wound, left foot, initial encounter: Secondary | ICD-10-CM

## 2020-02-09 ENCOUNTER — Telehealth: Payer: Self-pay | Admitting: Internal Medicine

## 2020-02-09 NOTE — Telephone Encounter (Signed)
Great, thanks

## 2020-02-09 NOTE — Telephone Encounter (Signed)
Pt is wanting to inform Dr Hilarie Fredrickson that the hemorrhoid banding did help him, the issue has resolved.

## 2020-02-13 NOTE — Progress Notes (Signed)
Adjusted f/o 

## 2020-02-20 ENCOUNTER — Telehealth: Payer: Self-pay | Admitting: Podiatry

## 2020-02-20 NOTE — Telephone Encounter (Signed)
Pt left message stating he was told he would get a call when his orthotics came in. He is scheduled tomorrow to pick up but would like a call to make sure we have them before coming to appt.  I returned call and left message that the orthotics have not came in and I would discuss with Liliane Channel tomorrow morning and give him an update.

## 2020-02-21 ENCOUNTER — Other Ambulatory Visit: Payer: Medicare Other | Admitting: Orthotics

## 2020-03-02 ENCOUNTER — Telehealth: Payer: Self-pay | Admitting: Podiatry

## 2020-03-02 NOTE — Telephone Encounter (Signed)
Pt called checking to see if orthotics have came in yet.   Pt stated [name] told him they would be in earlier this week. Pt is going out of the country today and will be back towards end of month. I apologized and told pt I would mail orthotics to his home when they come in.

## 2020-06-11 ENCOUNTER — Other Ambulatory Visit: Payer: Self-pay

## 2020-06-11 ENCOUNTER — Encounter: Payer: Self-pay | Admitting: Physical Medicine and Rehabilitation

## 2020-06-11 ENCOUNTER — Encounter
Payer: Medicare Other | Attending: Physical Medicine and Rehabilitation | Admitting: Physical Medicine and Rehabilitation

## 2020-06-11 VITALS — HR 67 | Temp 97.8°F | Ht 71.0 in | Wt 150.0 lb

## 2020-06-11 DIAGNOSIS — G894 Chronic pain syndrome: Secondary | ICD-10-CM | POA: Insufficient documentation

## 2020-06-11 DIAGNOSIS — Z79891 Long term (current) use of opiate analgesic: Secondary | ICD-10-CM | POA: Diagnosis present

## 2020-06-11 DIAGNOSIS — Z87828 Personal history of other (healed) physical injury and trauma: Secondary | ICD-10-CM

## 2020-06-11 DIAGNOSIS — S32008S Other fracture of unspecified lumbar vertebra, sequela: Secondary | ICD-10-CM | POA: Insufficient documentation

## 2020-06-11 DIAGNOSIS — Z5181 Encounter for therapeutic drug level monitoring: Secondary | ICD-10-CM

## 2020-06-11 DIAGNOSIS — M76811 Anterior tibial syndrome, right leg: Secondary | ICD-10-CM | POA: Diagnosis present

## 2020-06-11 DIAGNOSIS — S34109S Unspecified injury to unspecified level of lumbar spinal cord, sequela: Secondary | ICD-10-CM

## 2020-06-11 DIAGNOSIS — G4701 Insomnia due to medical condition: Secondary | ICD-10-CM | POA: Insufficient documentation

## 2020-06-11 MED ORDER — MELOXICAM 15 MG PO TABS: 15.0000 mg | ORAL_TABLET | Freq: Every day | ORAL | 1 refills | Status: DC | PRN

## 2020-06-11 MED ORDER — AMITRIPTYLINE HCL 25 MG PO TABS: 25.0000 mg | ORAL_TABLET | Freq: Every day | ORAL | 1 refills | Status: DC
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
## 2020-06-19 LAB — TOXASSURE SELECT,+ANTIDEPR,UR

## 2020-06-21 ENCOUNTER — Telehealth: Payer: Self-pay | Admitting: *Deleted

## 2020-06-21 NOTE — Telephone Encounter (Signed)
Urine drug screen for this encounter is consistent for prescribed medication amitriptyline.

## 2020-07-02 ENCOUNTER — Telehealth: Payer: Self-pay | Admitting: Internal Medicine

## 2020-07-02 NOTE — Telephone Encounter (Signed)
Pt states he has had 3 bandings done and has not had improvement. Pt wants to know what the next step is or if he needs a referral some where else. Please advise.

## 2020-07-02 NOTE — Telephone Encounter (Signed)
Pt is requesting a call back from a nurse to discuss a possible referral for his internal hemorrhoids since the banding visits with Dr Hilarie Fredrickson have not helped him.

## 2020-07-02 NOTE — Telephone Encounter (Signed)
Inbound call from patient requesting for referral to be sent to different office please.  Please advise.

## 2020-07-02 NOTE — Telephone Encounter (Signed)
Spoke with pt and he is aware. Referral faxed to [facility]. He knows to expect a call regarding an appt.

## 2020-07-02 NOTE — Telephone Encounter (Signed)
Surgical referral to [facility] for evaluation of symptomatic hemorrhoids no responding to banding.

## 2020-07-03 NOTE — Telephone Encounter (Signed)
Pt did not want to be referred to [facility], states reviews were all bad online. Pt wanted to be referred to [facility]. Pt scheduled to see Dr. Jennye Boroughs 10/15/20@8 :30am, pt will get info packet in the mail. Records faxed to [phone].

## 2020-07-05 ENCOUNTER — Other Ambulatory Visit: Payer: Self-pay | Admitting: Physical Medicine and Rehabilitation

## 2020-07-06 ENCOUNTER — Ambulatory Visit: Payer: Medicare Other | Admitting: Physical Medicine and Rehabilitation

## 2020-07-06 ENCOUNTER — Telehealth: Payer: Self-pay | Admitting: *Deleted

## 2020-07-06 MED ORDER — AMITRIPTYLINE HCL 25 MG PO TABS: 25.0000 mg | ORAL_TABLET | Freq: Every day | ORAL | 0 refills | Status: DC

## 2020-07-06 NOTE — Telephone Encounter (Signed)
Sent to [employer] and Mr [place] notified.

## 2020-07-06 NOTE — Telephone Encounter (Signed)
[name] called and reports that he is going to be out of the country for approx 4 months and he is asking for a refill on his amitriptyline (4 months supply #120) be written for so that he can fill it and take with him.

## 2020-07-06 NOTE — Telephone Encounter (Signed)
That would be fine! Are you able to put the order in?

## 2020-09-12 ENCOUNTER — Telehealth: Payer: Self-pay | Admitting: Family Medicine

## 2020-09-12 NOTE — Telephone Encounter (Signed)
Left message for patient to call back and schedule Medicare Annual Wellness Visit (AWV).   Please offer to do virtually or by telephone.   Due for AWVI  Please schedule at anytime with Nurse Health Advisor.   

## 2020-11-07 ENCOUNTER — Other Ambulatory Visit: Payer: Self-pay

## 2020-11-07 ENCOUNTER — Encounter
Payer: Medicare Other | Attending: Physical Medicine and Rehabilitation | Admitting: Physical Medicine and Rehabilitation

## 2020-11-07 ENCOUNTER — Encounter: Payer: Self-pay | Admitting: Physical Medicine and Rehabilitation

## 2020-11-07 ENCOUNTER — Other Ambulatory Visit: Payer: Self-pay | Admitting: *Deleted

## 2020-11-07 VITALS — BP 123/77 | HR 60 | Temp 98.7°F | Ht 71.0 in | Wt 159.0 lb

## 2020-11-07 DIAGNOSIS — Z7409 Other reduced mobility: Secondary | ICD-10-CM | POA: Diagnosis present

## 2020-11-07 DIAGNOSIS — S32008S Other fracture of unspecified lumbar vertebra, sequela: Secondary | ICD-10-CM

## 2020-11-07 DIAGNOSIS — S34109S Unspecified injury to unspecified level of lumbar spinal cord, sequela: Secondary | ICD-10-CM | POA: Insufficient documentation

## 2020-11-07 MED ORDER — LIDOCAINE 5 % EX PTCH: 1.0000 | MEDICATED_PATCH | CUTANEOUS | 3 refills | Status: DC

## 2020-11-07 MED ORDER — GABAPENTIN 600 MG PO TABS: 1200.0000 mg | ORAL_TABLET | Freq: Four times a day (QID) | ORAL | 2 refills | Status: DC

## 2020-11-07 MED ORDER — TIZANIDINE HCL 4 MG PO TABS: 2.0000 mg | ORAL_TABLET | Freq: Four times a day (QID) | ORAL | 3 refills | Status: DC | PRN

## 2020-11-07 MED ORDER — AMITRIPTYLINE HCL 25 MG PO TABS: 25.0000 mg | ORAL_TABLET | Freq: Every day | ORAL | 2 refills | Status: DC

## 2020-11-07 NOTE — Patient Instructions (Signed)
-  Discussed following foods that may reduce pain: 1) Ginger (especially studied for arthritis)- reduce leukotriene production to decrease inflammation 2) Blueberries- high in phytonutrients that decrease inflammation 3) Salmon- marine omega-3s reduce joint swelling and pain 4) Pumpkin seeds- reduce inflammation 5) dark chocolate- reduces inflammation 6) turmeric- reduces inflammation 7) tart cherries - reduce pain and stiffness 8) extra virgin olive oil - its compound olecanthal helps to block prostaglandins  9) chili peppers- can be eaten or applied topically via capsaicin 10) mint- helpful for headache, muscle aches, joint pain, and itching 11) garlic- reduces inflammation  Link to further information on diet for chronic pain: http://[email or web address]/
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
## 2020-12-05 NOTE — Progress Notes (Signed)
Discussed by phone Tramadol as option for his pain- UDS and pain contract already signed- ordered '50mg'$  daily. Patient plans to come on today to pick up his disability paperwork.

## 2020-12-07 ENCOUNTER — Encounter
Payer: Medicare Other | Attending: Physical Medicine and Rehabilitation | Admitting: Physical Medicine and Rehabilitation

## 2020-12-07 ENCOUNTER — Other Ambulatory Visit: Payer: Self-pay

## 2020-12-07 DIAGNOSIS — S32008S Other fracture of unspecified lumbar vertebra, sequela: Secondary | ICD-10-CM

## 2020-12-07 DIAGNOSIS — S34109S Unspecified injury to unspecified level of lumbar spinal cord, sequela: Secondary | ICD-10-CM

## 2020-12-07 DIAGNOSIS — Z7409 Other reduced mobility: Secondary | ICD-10-CM

## 2020-12-07 MED ORDER — TRAMADOL HCL 50 MG PO TABS: 50.0000 mg | ORAL_TABLET | Freq: Every day | ORAL | 0 refills | Status: DC | PRN

## 2020-12-07 MED ORDER — ACETAMINOPHEN-CODEINE 300-30 MG PO TABS: 1.0000 | ORAL_TABLET | Freq: Every day | ORAL | 0 refills | Status: DC | PRN

## 2020-12-07 NOTE — Addendum Note (Signed)
Addended by: Izora Ribas on: 12/07/2020 04:42 PM   Modules accepted: Orders

## 2020-12-27 ENCOUNTER — Ambulatory Visit: Payer: Medicare Other | Admitting: Physical Medicine and Rehabilitation

## 2021-01-01 ENCOUNTER — Other Ambulatory Visit: Payer: Self-pay | Admitting: Physical Medicine and Rehabilitation

## 2021-01-01 MED ORDER — METHOCARBAMOL 500 MG PO TABS: 500.0000 mg | ORAL_TABLET | Freq: Four times a day (QID) | ORAL | 3 refills | Status: DC

## 2021-02-07 ENCOUNTER — Other Ambulatory Visit: Payer: Self-pay

## 2021-02-07 ENCOUNTER — Encounter
Payer: Medicare Other | Attending: Physical Medicine and Rehabilitation | Admitting: Physical Medicine and Rehabilitation

## 2021-02-07 VITALS — BP 115/75 | HR 59 | Temp 98.0°F | Ht 71.0 in | Wt 153.2 lb

## 2021-02-07 DIAGNOSIS — R208 Other disturbances of skin sensation: Secondary | ICD-10-CM | POA: Diagnosis not present

## 2021-02-07 DIAGNOSIS — S32008S Other fracture of unspecified lumbar vertebra, sequela: Secondary | ICD-10-CM | POA: Insufficient documentation

## 2021-02-07 DIAGNOSIS — S34109S Unspecified injury to unspecified level of lumbar spinal cord, sequela: Secondary | ICD-10-CM | POA: Insufficient documentation
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
## 2021-02-15 ENCOUNTER — Telehealth: Payer: Self-pay | Admitting: Internal Medicine

## 2021-02-15 NOTE — Telephone Encounter (Signed)
Explained to pt that the surgery center is just a surgical facility and the [facility] doctors go to the hospitals and to that facility. He wanted to know who he should be scheduled with, explained the colorectal surgeons are Dr. Dema Severin or Dr. Marcello Moores. He states he will call the office back to set up an appt.

## 2021-02-15 NOTE — Telephone Encounter (Signed)
Patient called requesting to be referred to [facility] for his hemorrhoid surgery.  Appointments at [facility] are about six months out and they have bad reviews.  He is unsure if if [facility] even does the hemorrhoid surgery, but if they do, he'd like to be referred there.  Please call patient and advise.  Thank you.

## 2021-05-27 ENCOUNTER — Ambulatory Visit: Payer: Self-pay | Admitting: General Surgery

## 2021-05-27 ENCOUNTER — Encounter (HOSPITAL_BASED_OUTPATIENT_CLINIC_OR_DEPARTMENT_OTHER): Payer: Self-pay | Admitting: General Surgery

## 2021-05-27 NOTE — H&P (View-Only) (Signed)
PROVIDER:  Monico Blitz, MD   MRN: [id number] DOB: [birth year] DATE OF ENCOUNTER: 05/27/2021   Subjective   Chief Complaint: Hemorrhoids       History of Present Illness: [name] is a 61 y.o. male who is seen today as an office consultation at the request of Self for evaluation of Hemorrhoids 61 year old male who presents to the office for evaluation of a prolapsing hemorrhoid.  He states that this has been present for approximately a year and a half.  He has tried rubber band ligation with his Gastroenterologist (Dr. Hilarie Fredrickson).  This has been on successful.  He is here today to be evaluated for surgical options.     Review of Systems: A complete review of systems was obtained from the patient.  I have reviewed this information and discussed as appropriate with the patient.  See HPI as well for other ROS.     Medical History: Past Medical History Past Medical History: Diagnosis Date  Depression 05/1999  GERD (gastroesophageal reflux disease) 2007      There is no problem list on file for this patient.     Past Surgical History Past Surgical History: Procedure Laterality Date  [place]  BACK SURGERY   2000      Allergies Not on File    Current Outpatient Medications on File Prior to Visit Medication Sig Dispense Refill  amitriptyline (ELAVIL) 10 MG tablet Take by mouth      calcium carbonate 500 mg calcium (1,250 mg) tablet        calcium-D3-zinc-copper-mangan 325 mg-12.5 mcg -2.75 mg Tab        cyanocobalamin (VITAMIN B12) 500 MCG tablet Take by mouth      ferrous sulfate 325 (65 FE) MG tablet Take by mouth      gabapentin (NEURONTIN) 600 MG tablet Taking 4800mg  PO daily       No current facility-administered medications on file prior to visit.     Family History History reviewed. No pertinent family history.     Social History   Tobacco Use Smoking Status Never Smokeless Tobacco Not on file      Social History Social History    Socioeconomic History  Marital status: Single Tobacco Use  Smoking status: Never Substance and Sexual Activity  Alcohol use: Not Currently  Drug use: Never  Sexual activity: Not Currently      Objective:     Vitals:   05/27/21 1413 BP: 102/74 Pulse: 89 Temp: 36.8 C (98.2 F) SpO2: 98% Weight: 70.2 kg (154 lb 12.8 oz) Height: 180.3 cm (5\' 11" )     Exam Gen: NAD CV: RRR Lungs: CTA, unlabored Abd: soft Rectal: normal external exam, good rectal tone     Labs, Imaging and Diagnostic Testing:   Procedure: Anoscopy Surgeon: Marcello Moores After the risks and benefits were explained, written consent was obtained for above procedure.  A medical assistant chaperone was present thoroughout the entire procedure.  Anesthesia: none Diagnosis: hemorrhoids Findings:Grade 3 RP internal hemorrhoid.  Otherwise normal exam   Assessment and Plan: Diagnoses and all orders for this visit:   Prolapsed internal hemorrhoids, grade 53     61 year old male who presents to the office with a prolapsing grade 3 hemorrhoid.  He has tried rubber band ligation with minimal success.  I have recommended standard single column hemorrhoidectomy.  We have discussed this in detail including postoperative pain and healing time.  Risk include bleeding, pain, urinary  retention and recurrence.

## 2021-05-27 NOTE — H&P (Signed)
PROVIDER:  Monico Blitz, MD   MRN: [id number] DOB: [birth year] DATE OF ENCOUNTER: 05/27/2021   Subjective   Chief Complaint: Hemorrhoids       History of Present Illness: [name] is a 61 y.o. male who is seen today as an office consultation at the request of Self for evaluation of Hemorrhoids 61 year old male who presents to the office for evaluation of a prolapsing hemorrhoid.  He states that this has been present for approximately a year and a half.  He has tried rubber band ligation with his Gastroenterologist (Dr. Hilarie Fredrickson).  This has been on successful.  He is here today to be evaluated for surgical options.     Review of Systems: A complete review of systems was obtained from the patient.  I have reviewed this information and discussed as appropriate with the patient.  See HPI as well for other ROS.     Medical History: Past Medical History Past Medical History: Diagnosis Date  Depression 05/1999  GERD (gastroesophageal reflux disease) 2007      There is no problem list on file for this patient.     Past Surgical History Past Surgical History: Procedure Laterality Date  [place]  BACK SURGERY   2000      Allergies Not on File    Current Outpatient Medications on File Prior to Visit Medication Sig Dispense Refill  amitriptyline (ELAVIL) 10 MG tablet Take by mouth      calcium carbonate 500 mg calcium (1,250 mg) tablet        calcium-D3-zinc-copper-mangan 325 mg-12.5 mcg -2.75 mg Tab        cyanocobalamin (VITAMIN B12) 500 MCG tablet Take by mouth      ferrous sulfate 325 (65 FE) MG tablet Take by mouth      gabapentin (NEURONTIN) 600 MG tablet Taking 4800mg  PO daily       No current facility-administered medications on file prior to visit.     Family History History reviewed. No pertinent family history.     Social History   Tobacco Use Smoking Status Never Smokeless Tobacco Not on file      Social History Social History    Socioeconomic History  Marital status: Single Tobacco Use  Smoking status: Never Substance and Sexual Activity  Alcohol use: Not Currently  Drug use: Never  Sexual activity: Not Currently      Objective:     Vitals:   05/27/21 1413 BP: 102/74 Pulse: 89 Temp: 36.8 C (98.2 F) SpO2: 98% Weight: 70.2 kg (154 lb 12.8 oz) Height: 180.3 cm (5\' 11" )     Exam Gen: NAD CV: RRR Lungs: CTA, unlabored Abd: soft Rectal: normal external exam, good rectal tone     Labs, Imaging and Diagnostic Testing:   Procedure: Anoscopy Surgeon: Marcello Moores After the risks and benefits were explained, written consent was obtained for above procedure.  A medical assistant chaperone was present thoroughout the entire procedure.  Anesthesia: none Diagnosis: hemorrhoids Findings:Grade 3 RP internal hemorrhoid.  Otherwise normal exam   Assessment and Plan: Diagnoses and all orders for this visit:   Prolapsed internal hemorrhoids, grade 23     61 year old male who presents to the office with a prolapsing grade 3 hemorrhoid.  He has tried rubber band ligation with minimal success.  I have recommended standard single column hemorrhoidectomy.  We have discussed this in detail including postoperative pain and healing time.  Risk include bleeding, pain, urinary  retention and recurrence.

## 2021-05-28 ENCOUNTER — Other Ambulatory Visit: Payer: Self-pay | Admitting: General Surgery

## 2021-05-28 ENCOUNTER — Encounter (HOSPITAL_BASED_OUTPATIENT_CLINIC_OR_DEPARTMENT_OTHER): Payer: Self-pay | Admitting: General Surgery

## 2021-05-28 ENCOUNTER — Other Ambulatory Visit: Payer: Self-pay
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
## 2021-05-29 LAB — SARS CORONAVIRUS 2 (TAT 6-24 HRS): SARS Coronavirus 2: NEGATIVE

## 2021-05-31 ENCOUNTER — Ambulatory Visit (HOSPITAL_BASED_OUTPATIENT_CLINIC_OR_DEPARTMENT_OTHER): Payer: Medicare Other | Admitting: Anesthesiology

## 2021-05-31 ENCOUNTER — Ambulatory Visit (HOSPITAL_BASED_OUTPATIENT_CLINIC_OR_DEPARTMENT_OTHER)
Admission: RE | Admit: 2021-05-31 | Discharge: 2021-05-31 | Disposition: A | Discharge status: Home / Self Care | Payer: Medicare Other | Attending: General Surgery | Admitting: General Surgery

## 2021-05-31 ENCOUNTER — Other Ambulatory Visit: Payer: Self-pay

## 2021-05-31 ENCOUNTER — Encounter (HOSPITAL_BASED_OUTPATIENT_CLINIC_OR_DEPARTMENT_OTHER)
Admission: RE | Disposition: A | Discharge status: Home / Self Care | Payer: Self-pay | Source: Home / Self Care | Attending: General Surgery

## 2021-05-31 ENCOUNTER — Encounter (HOSPITAL_BASED_OUTPATIENT_CLINIC_OR_DEPARTMENT_OTHER): Payer: Self-pay | Admitting: General Surgery

## 2021-05-31 DIAGNOSIS — F419 Anxiety disorder, unspecified: Secondary | ICD-10-CM

## 2021-05-31 DIAGNOSIS — M1990 Unspecified osteoarthritis, unspecified site: Secondary | ICD-10-CM | POA: Diagnosis not present

## 2021-05-31 DIAGNOSIS — I440 Atrioventricular block, first degree: Secondary | ICD-10-CM | POA: Insufficient documentation

## 2021-05-31 DIAGNOSIS — K6282 Dysplasia of anus: Secondary | ICD-10-CM | POA: Diagnosis not present

## 2021-05-31 DIAGNOSIS — K642 Third degree hemorrhoids: Secondary | ICD-10-CM | POA: Insufficient documentation

## 2021-05-31 HISTORY — DX: Other hemorrhoids: K64.8

## 2021-05-31 HISTORY — DX: Presence of spectacles and contact lenses: Z97.3

## 2021-05-31 HISTORY — DX: Personal history of COVID-19: Z86.16

## 2021-05-31 HISTORY — PX: HEMORRHOID SURGERY: SHX153

## 2021-05-31 HISTORY — DX: Unspecified osteoarthritis, unspecified site: M19.90

## 2021-05-31 HISTORY — DX: Anxiety disorder, unspecified: F41.9

## 2021-05-31 SURGERY — HEMORRHOIDECTOMY
Anesthesia: Monitor Anesthesia Care | Site: Rectum

## 2021-05-31 MED ORDER — ACETAMINOPHEN 500 MG PO TABS
ORAL_TABLET | ORAL | Status: AC
  Filled 2021-05-31: qty 2

## 2021-05-31 MED ORDER — OXYCODONE HCL 5 MG/5ML PO SOLN: 5.0000 mg | Freq: Once | ORAL | Status: DC | PRN

## 2021-05-31 MED ORDER — LIDOCAINE HCL (CARDIAC) PF 100 MG/5ML IV SOSY
PREFILLED_SYRINGE | INTRAVENOUS | Status: DC | PRN
Start: 2021-05-31 — End: 2021-05-31
  Administered 2021-05-31: 40 mg via INTRAVENOUS

## 2021-05-31 MED ORDER — 0.9 % SODIUM CHLORIDE (POUR BTL) OPTIME
TOPICAL | Status: DC | PRN
  Administered 2021-05-31: 500 mL

## 2021-05-31 MED ORDER — LACTATED RINGERS IV SOLN: INTRAVENOUS | Status: DC

## 2021-05-31 MED ORDER — KETOROLAC TROMETHAMINE 30 MG/ML IJ SOLN
INTRAMUSCULAR | Status: AC
  Filled 2021-05-31: qty 1

## 2021-05-31 MED ORDER — DIAZEPAM 5 MG PO TABS: 5.0000 mg | ORAL_TABLET | Freq: Three times a day (TID) | ORAL | 0 refills | Status: DC | PRN

## 2021-05-31 MED ORDER — FENTANYL CITRATE (PF) 100 MCG/2ML IJ SOLN
INTRAMUSCULAR | Status: DC | PRN
Start: 2021-05-31 — End: 2021-05-31
  Administered 2021-05-31: 12.5 ug via INTRAVENOUS
  Administered 2021-05-31: 25 ug via INTRAVENOUS
  Administered 2021-05-31: 12.5 ug via INTRAVENOUS

## 2021-05-31 MED ORDER — KETOROLAC TROMETHAMINE 30 MG/ML IJ SOLN: 30.0000 mg | Freq: Once | INTRAMUSCULAR | Status: DC | PRN

## 2021-05-31 MED ORDER — ONDANSETRON HCL 4 MG/2ML IJ SOLN: 4.0000 mg | Freq: Once | INTRAMUSCULAR | Status: DC | PRN

## 2021-05-31 MED ORDER — BUPIVACAINE LIPOSOME 1.3 % IJ SUSP: 20.0000 mL | Freq: Once | INTRAMUSCULAR | Status: DC

## 2021-05-31 MED ORDER — ONDANSETRON HCL 4 MG/2ML IJ SOLN
INTRAMUSCULAR | Status: AC
  Filled 2021-05-31: qty 2

## 2021-05-31 MED ORDER — OXYCODONE HCL 5 MG PO TABS: 5.0000 mg | ORAL_TABLET | Freq: Four times a day (QID) | ORAL | 0 refills | Status: DC | PRN

## 2021-05-31 MED ORDER — PROPOFOL 500 MG/50ML IV EMUL
INTRAVENOUS | Status: AC
  Filled 2021-05-31: qty 50

## 2021-05-31 MED ORDER — DEXAMETHASONE SODIUM PHOSPHATE 4 MG/ML IJ SOLN
INTRAMUSCULAR | Status: DC | PRN
  Administered 2021-05-31: 5 mg via INTRAVENOUS

## 2021-05-31 MED ORDER — BUPIVACAINE-EPINEPHRINE 0.5% -1:200000 IJ SOLN
INTRAMUSCULAR | Status: DC | PRN
  Administered 2021-05-31: 20 mL

## 2021-05-31 MED ORDER — KETAMINE HCL 50 MG/5ML IJ SOSY
PREFILLED_SYRINGE | INTRAMUSCULAR | Status: AC
  Filled 2021-05-31: qty 5

## 2021-05-31 MED ORDER — KETAMINE HCL 10 MG/ML IJ SOLN
INTRAMUSCULAR | Status: DC | PRN
  Administered 2021-05-31 (×3): 10 mg via INTRAVENOUS

## 2021-05-31 MED ORDER — PROPOFOL 10 MG/ML IV BOLUS
INTRAVENOUS | Status: AC
  Filled 2021-05-31: qty 20

## 2021-05-31 MED ORDER — FENTANYL CITRATE (PF) 100 MCG/2ML IJ SOLN
INTRAMUSCULAR | Status: AC
  Filled 2021-05-31: qty 2

## 2021-05-31 MED ORDER — CELECOXIB 200 MG PO CAPS
200.0000 mg | ORAL_CAPSULE | ORAL | Status: AC
  Administered 2021-05-31: 200 mg via ORAL

## 2021-05-31 MED ORDER — SODIUM CHLORIDE 0.9% FLUSH: 3.0000 mL | Freq: Two times a day (BID) | INTRAVENOUS | Status: DC

## 2021-05-31 MED ORDER — DEXAMETHASONE SODIUM PHOSPHATE 10 MG/ML IJ SOLN
INTRAMUSCULAR | Status: AC
  Filled 2021-05-31: qty 1

## 2021-05-31 MED ORDER — KETOROLAC TROMETHAMINE 30 MG/ML IJ SOLN
INTRAMUSCULAR | Status: DC | PRN
  Administered 2021-05-31: 30 mg via INTRAVENOUS

## 2021-05-31 MED ORDER — MIDAZOLAM HCL 2 MG/2ML IJ SOLN
INTRAMUSCULAR | Status: AC
  Filled 2021-05-31: qty 2

## 2021-05-31 MED ORDER — PROPOFOL 500 MG/50ML IV EMUL
INTRAVENOUS | Status: DC | PRN
Start: 2021-05-31 — End: 2021-05-31
  Administered 2021-05-31: 50 ug/kg/min via INTRAVENOUS

## 2021-05-31 MED ORDER — ONDANSETRON HCL 4 MG/2ML IJ SOLN
INTRAMUSCULAR | Status: DC | PRN
  Administered 2021-05-31: 4 mg via INTRAVENOUS

## 2021-05-31 MED ORDER — LIDOCAINE HCL (PF) 2 % IJ SOLN
INTRAMUSCULAR | Status: AC
  Filled 2021-05-31: qty 5

## 2021-05-31 MED ORDER — OXYCODONE HCL 5 MG PO TABS: 5.0000 mg | ORAL_TABLET | Freq: Once | ORAL | Status: DC | PRN

## 2021-05-31 MED ORDER — LIDOCAINE 5 % EX OINT
TOPICAL_OINTMENT | CUTANEOUS | Status: DC | PRN
  Administered 2021-05-31: 1

## 2021-05-31 MED ORDER — HYDROMORPHONE HCL 1 MG/ML IJ SOLN: 0.2500 mg | INTRAMUSCULAR | Status: DC | PRN

## 2021-05-31 MED ORDER — CELECOXIB 200 MG PO CAPS
ORAL_CAPSULE | ORAL | Status: AC
  Filled 2021-05-31: qty 1

## 2021-05-31 MED ORDER — BUPIVACAINE LIPOSOME 1.3 % IJ SUSP
INTRAMUSCULAR | Status: DC | PRN
  Administered 2021-05-31: 20 mL

## 2021-05-31 MED ORDER — MIDAZOLAM HCL 5 MG/5ML IJ SOLN
INTRAMUSCULAR | Status: DC | PRN
  Administered 2021-05-31 (×2): 1 mg via INTRAVENOUS
  Administered 2021-05-31: 2 mg via INTRAVENOUS

## 2021-05-31 MED ORDER — ACETAMINOPHEN 500 MG PO TABS
1000.0000 mg | ORAL_TABLET | ORAL | Status: AC
  Administered 2021-05-31: 1000 mg via ORAL

## 2021-05-31 SURGICAL SUPPLY — 30 items
COVER BACK TABLE 60X90IN (DRAPES) ×2 IMPLANT
COVER MAYO STAND STRL (DRAPES) ×2 IMPLANT
DRAPE LAPAROTOMY 100X72 PEDS (DRAPES) ×2 IMPLANT
DRAPE UTILITY XL STRL (DRAPES) ×2 IMPLANT
DRSG PAD ABDOMINAL 8X10 ST (GAUZE/BANDAGES/DRESSINGS) ×2 IMPLANT
ELECT REM PT RETURN 9FT ADLT (ELECTROSURGICAL) ×2
ELECTRODE REM PT RTRN 9FT ADLT (ELECTROSURGICAL) ×1 IMPLANT
GAUZE 4X4 16PLY ~~LOC~~+RFID DBL (SPONGE) ×2 IMPLANT
GAUZE SPONGE 4X4 12PLY STRL (GAUZE/BANDAGES/DRESSINGS) ×1 IMPLANT
GLOVE SURG ENC MOIS LTX SZ6.5 (GLOVE) ×2 IMPLANT
GLOVE SURG POLYISO LF SZ6.5 (GLOVE) ×1 IMPLANT
GLOVE SURG UNDER LTX SZ6.5 (GLOVE) ×2 IMPLANT
GLOVE SURG UNDER POLY LF SZ6.5 (GLOVE) ×1 IMPLANT
GLOVE SURG UNDER POLY LF SZ7 (GLOVE) ×1 IMPLANT
GOWN STRL REUS W/TWL LRG LVL3 (GOWN DISPOSABLE) ×1 IMPLANT
KIT TURNOVER CYSTO (KITS) ×2 IMPLANT
NEEDLE HYPO 22GX1.5 SAFETY (NEEDLE) ×2 IMPLANT
NS IRRIG 500ML POUR BTL (IV SOLUTION) ×2 IMPLANT
PACK BASIN DAY SURGERY FS (CUSTOM PROCEDURE TRAY) ×2 IMPLANT
PANTS MESH DISP LRG (UNDERPADS AND DIAPERS) IMPLANT
PANTS MESH DISPOSABLE L (UNDERPADS AND DIAPERS) ×1
PENCIL SMOKE EVACUATOR (MISCELLANEOUS) ×2 IMPLANT
SOL PREP POV-IOD 4OZ 10% (MISCELLANEOUS) ×1 IMPLANT
SUT CHROMIC 2 0 SH (SUTURE) ×4 IMPLANT
SUT CHROMIC 3 0 SH 27 (SUTURE) ×3 IMPLANT
SYR CONTROL 10ML LL (SYRINGE) ×2 IMPLANT
TOWEL OR 17X26 10 PK STRL BLUE (TOWEL DISPOSABLE) ×2 IMPLANT
TRAY DSU PREP LF (CUSTOM PROCEDURE TRAY) ×2 IMPLANT
TUBE CONNECTING 12X1/4 (SUCTIONS) ×2 IMPLANT
YANKAUER SUCT BULB TIP NO VENT (SUCTIONS) ×2 IMPLANT

## 2021-05-31 NOTE — Interval H&P Note (Signed)
History and Physical Interval Note: ? ?05/31/2021 ?1:06 PM ? ?[name]  has presented today for surgery, with the diagnosis of GRADE 3 HEMORRHOID.  The various methods of treatment have been discussed with the patient and family. After consideration of risks, benefits and other options for treatment, the patient has consented to  Procedure(s): ?SINGLE COLUMN HEMORRHOIDECTOMY (N/A) as a surgical intervention.  The patient's history has been reviewed, patient examined, no change in status, stable for surgery.  I have reviewed the patient's chart and labs.  Questions were answered to the patient's satisfaction.   ? ? ?Rosario Adie, MD ? ?Colorectal and General Surgery ?[facility] ? ? ?

## 2021-05-31 NOTE — Anesthesia Preprocedure Evaluation (Addendum)
Anesthesia Evaluation  ?Patient identified by MRN, date of birth, ID band ?Patient awake ? ? ? ?Reviewed: ?Allergy & Precautions, NPO status , Patient's Chart, lab work & pertinent test results ? ?Airway ?Mallampati: II ? ?TM Distance: >3 FB ?Neck ROM: Full ? ? ? Dental ?no notable dental hx. ?(+) Teeth Intact, Dental Advisory Given ?  ?Pulmonary ?neg pulmonary ROS,  ?  ?Pulmonary exam normal ?breath sounds clear to auscultation ? ? ? ? ? ? Cardiovascular ?Exercise Tolerance: Good ?negative cardio ROS ?Normal cardiovascular exam ?Rhythm:Regular Rate:Normal ? ? ?  ?Neuro/Psych ?Anxiety   ? GI/Hepatic ?Neg liver ROS, GERD  ,  ?Endo/Other  ?negative endocrine ROS ? Renal/GU ?negative Renal ROS  ? ?  ?Musculoskeletal ? ?(+) Arthritis ,  ? Abdominal ?  ?Peds ? Hematology ?  ?Anesthesia Other Findings ? ? Reproductive/Obstetrics ? ?  ? ? ? ? ? ? ? ? ? ? ? ? ? ?  ?  ? ? ? ? ? ? ? ?Anesthesia Physical ?Anesthesia Plan ? ?ASA: 1 ? ?Anesthesia Plan: MAC  ? ?Post-op Pain Management:   ? ?Induction: Intravenous ? ?PONV Risk Score and Plan: 2 and Treatment may vary due to age or medical condition and Midazolam ? ?Airway Management Planned: Natural Airway and Simple Face Mask ? ?Additional Equipment: None ? ?Intra-op Plan:  ? ?Post-operative Plan:  ? ?Informed Consent: I have reviewed the patients History and Physical, chart, labs and discussed the procedure including the risks, benefits and alternatives for the proposed anesthesia with the patient or authorized representative who has indicated his/her understanding and acceptance.  ? ? ? ?Dental advisory given ? ?Plan Discussed with: CRNA ? ?Anesthesia Plan Comments:   ? ? ? ? ? ? ?Anesthesia Quick Evaluation ? ?

## 2021-05-31 NOTE — Anesthesia Procedure Notes (Signed)
Procedure Name: Cutter ?Date/Time: 05/31/2021 2:21 PM ?Performed by: Justice Rocher, CRNA ?Pre-anesthesia Checklist: Timeout performed, Patient being monitored, Suction available, Emergency Drugs available and Patient identified ?Patient Re-evaluated:Patient Re-evaluated prior to induction ?Oxygen Delivery Method: Simple face mask ?Preoxygenation: Pre-oxygenation with 100% oxygen ?Induction Type: IV induction ?Placement Confirmation: breath sounds checked- equal and bilateral, CO2 detector and positive ETCO2 ? ? ? ? ?

## 2021-05-31 NOTE — Anesthesia Postprocedure Evaluation (Signed)
Anesthesia Post Note ? ?Patient: [name] ? ?Procedure(s) Performed: SINGLE COLUMN HEMORRHOIDECTOMY (Rectum) ? ?  ? ?Patient location during evaluation: PACU ?Anesthesia Type: MAC ?Level of consciousness: awake and alert ?Pain management: pain level controlled ?Vital Signs Assessment: post-procedure vital signs reviewed and stable ?Respiratory status: spontaneous breathing, nonlabored ventilation, respiratory function stable and patient connected to nasal cannula oxygen ?Cardiovascular status: stable and blood pressure returned to baseline ?Postop Assessment: no apparent nausea or vomiting ?Anesthetic complications: no ? ? ?No notable events documented. ? ?Last Vitals:  ?Vitals:  ? 05/31/21 1234 05/31/21 1500  ?BP: 123/79 130/62  ?Pulse: 68 75  ?Resp: 15 19  ?Temp: (!) 36.1 ?C 36.6 ?C  ?SpO2: 100% 99%  ?  ?Last Pain:  ?Vitals:  ? 05/31/21 1500  ?TempSrc:   ?PainSc: 0-No pain  ? ? ?  ?  ?  ?  ?  ?  ? ?[place]et [place] ? ? ? ? ?

## 2021-05-31 NOTE — Transfer of Care (Signed)
Immediate Anesthesia Transfer of Care Note ? ?Patient: [name] ? ?Procedure(s) Performed: Procedure(s) (LRB): ?SINGLE COLUMN HEMORRHOIDECTOMY (N/A) ? ?Patient Location: PACU ? ?Anesthesia Type: General ? ?Level of Consciousness: awake, sedated, patient cooperative and responds to stimulation ? ?Airway & Oxygen Therapy: Patient Spontanous Breathing and Patient on RA  ? ?Post-op Assessment: Report given to PACU RN, Post -op Vital signs reviewed and stable and Patient moving all extremities ? ?Post vital signs: Reviewed and stable ? ?Complications: No apparent anesthesia complications ?

## 2021-05-31 NOTE — Op Note (Signed)
05/31/2021 ? ?2:51 PM ? ?PATIENT:  [name]  61 y.o. male ? ?Patient Care Team: ?Libby Maw, MD as PCP - General (Family Medicine) ? ?PRE-OPERATIVE DIAGNOSIS:  GRADE 3 HEMORRHOID ? ?POST-OPERATIVE DIAGNOSIS:  GRADE 3 HEMORRHOID ? ?PROCEDURE:  ?SINGLE COLUMN HEMORRHOIDECTOMY ? ?SURGEON:  Surgeon(s): ?Leighton Ruff, MD ? ?ASSISTANT: none  ? ?ANESTHESIA:   local and MAC ? ?SPECIMEN:  Source of Specimen:  R posterior hemorrhoid ? ?DISPOSITION OF SPECIMEN:  PATHOLOGY ? ?COUNTS:  YES ? ?PLAN OF CARE: Discharge to home after PACU ? ?PATIENT DISPOSITION:  PACU - hemodynamically stable. ? ?INDICATION: 61 y.o. M with prolapse and rectal bleeding who has failed RBL ? ? ?OR FINDINGS: Grade 3 R posterior hemorrhoid ? ?DESCRIPTION: the patient was identified in the preoperative holding area and taken to the OR where they were laid on the operating room table.  MAC anesthesia was induced without difficulty. The patient was then positioned in prone jackknife position with buttocks gently taped apart.  The patient was then prepped and draped in usual sterile fashion.  SCDs were noted to be in place prior to the initiation of anesthesia. A surgical timeout was performed indicating the correct patient, procedure, positioning and need for preoperative antibiotics.  A rectal block was performed using Marcaine with epinephrine mixed with Experel.   ? ?I began with a digital rectal exam.  Rectal tone was good.  The anal canal was gently dilated.  I then placed a Hill-Ferguson anoscope into the anal canal and evaluated this completely.  There was a grade 3 right posterior internal hemorrhoid.  There was minimal external component.  Left lateral and right anterior hemorrhoids were grade 1.  I excised the right posterior hemorrhoid using Metzenbaum scissors.  Careful attention was made to free the sphincter complex from the hemorrhoid tissue prior to resection.  The tissue was sent to pathology for further examination.  The  anoderm was closed using a running 2-0 chromic suture.  Additional interrupted 2-0 chromic sutures were used for added hemostasis.  A running 3-0 chromic suture was used to close the anal skin distal to the dentate line.  Additional Marcaine was placed around the incision site.  Hemostasis was good.  Lidocaine ointment and dressing were applied.  The patient was then awakened from anesthesia and sent to the postanesthesia care unit in stable condition.  All counts were correct per operating room staff.    ?

## 2021-05-31 NOTE — Discharge Instructions (Signed)
ANORECTAL SURGERY: POST OP INSTRUCTIONS ?Take your usually prescribed home medications unless otherwise directed. ?DIET: During the first few hours after surgery sip on some liquids until you are able to urinate.  It is normal to not urinate for several hours after this surgery.  If you feel uncomfortable, please contact the office for instructions.  After you are able to urinate,you may eat, if you feel like it.  Follow a light bland diet the first 24 hours after arrival home, such as soup, liquids, crackers, etc.  Be sure to include lots of fluids daily (6-8 glasses).  Avoid fast food or heavy meals, as your are more likely to get nauseated.  Eat a low fat diet the next few days after surgery.  Limit caffeine intake to 1-2 servings a day. ?PAIN CONTROL: ?Pain is best controlled by a usual combination of several different methods TOGETHER: ?Muscle relaxation ? Soak in a warm bath (or Sitz bath) three times a day and after bowel movements.  Continue to do this until all pain is resolved. ?Take the muscle relaxer (Valium) every 6 hours for the first 2 days after surgery  ?Over the counter pain medication ?Prescription pain medication ?Most patients will experience some swelling and discomfort in the anus/rectal area and incisions.  Heat such as warm towels, sitz baths, warm baths, etc to help relax tight/sore spots and speed recovery.  Some people prefer to use ice, especially in the first couple days after surgery, as it may decrease the pain and swelling, or alternate between ice & heat.  Experiment to what works for you.  Swelling and bruising can take several weeks to resolve.  Pain can take even longer to completely resolve. ?It is helpful to take an over-the-counter pain medication regularly for the first few weeks.  Choose one of the following that works best for you: ?Naproxen (Aleve, etc)  Two '220mg'$  tabs twice a day ?Ibuprofen (Advil, etc) Three '200mg'$  tabs four times a day (every meal & bedtime) ?A   prescription for pain medication (such as percocet, oxycodone, hydrocodone, etc) should be given to you upon discharge.  Take your pain medication as prescribed.  ?If you are having problems/concerns with the prescription medicine (does not control pain, nausea, vomiting, rash, itching, etc), please call us [phone] to see if we need to switch you to a different pain medicine that will work better for you and/or control your side effect better. ?If you need a refill on your pain medication, please contact your pharmacy.  They will contact our office to request authorization. Prescriptions will not be filled after 5 pm or on week-ends. ?KEEP YOUR BOWELS REGULAR and AVOID CONSTIPATION ?The goal is one to two soft bowel movements a day.  You should at least have a bowel movement every other day. ?Avoid getting constipated.  Between the surgery and the pain medications, it is common to experience some constipation. This can be very painful after rectal surgery.  Increasing fluid intake and taking a fiber supplement (such as Metamucil, Citrucel, FiberCon, etc) 1-2 times a day regularly will usually help prevent this problem from occurring.  A stool softener like colace is also recommended.  This can be purchased over the counter at your pharmacy.  You can take it up to 3 times a day.  If you do not have a bowel movement after 24 hrs since your surgery, take one does of milk of magnesia.  If you still haven't had a bowel movement 8-12 hours after  that dose, take another dose.  If you don't have a bowel movement 48 hrs after surgery, purchase a Fleets enema from the drug store and administer gently per package instructions.  If you still are having trouble with your bowel movements after that, please call the office for further instructions. ?If you develop diarrhea or have many loose bowel movements, simplify your diet to bland foods & liquids for a few days.  Stop any stool softeners and decrease your fiber  supplement.  Switching to mild anti-diarrheal medications (Kayopectate, Pepto Bismol) can help.  If this worsens or does not improve, please call us. ? ?Wound Care ?Remove your bandages before your first bowel movement or 8 hours after surgery.     ?Remove any wound packing material at this tim,e as well.  You do not need to repack the wound unless instructed otherwise.  Wear an absorbent pad or soft cotton gauze in your underwear to catch any drainage and help keep the area clean. You should change this every 2-3 hours while awake. ?Keep the area clean and dry.  Bathe / shower every day, especially after bowel movements.  Keep the area clean by showering / bathing over the incision / wound.   It is okay to soak an open wound to help wash it.  Wet wipes or showers / gentle washing after bowel movements is often less traumatic than regular toilet paper. ?You may have some styrofoam-like soft packing in the rectum which will come out with the first bowel movement.  ?You will often notice bleeding with bowel movements.  This should slow down by the end of the first week of surgery ?Expect some drainage.  This should slow down, too, by the end of the first week of surgery.  Wear an absorbent pad or soft cotton gauze in your underwear until the drainage stops. ?Do Not sit on a rubber or pillow ring.  This can make you symptoms worse.  You may sit on a soft pillow if needed.  ?ACTIVITIES as tolerated:   ?You may resume regular (light) daily activities beginning the next day--such as daily self-care, walking, climbing stairs--gradually increasing activities as tolerated.  If you can walk 30 minutes without difficulty, it is safe to try more intense activity such as jogging, treadmill, bicycling, low-impact aerobics, swimming, etc. ?Save the most intensive and strenuous activity for last such as sit-ups, heavy lifting, contact sports, etc  Refrain from any heavy lifting or straining until you are off narcotics for pain  control.   ?You may drive when you are no longer taking prescription pain medication, you can comfortably sit for long periods of time, and you can safely maneuver your car and apply brakes. ?You may have sexual intercourse when it is comfortable.  ?FOLLOW UP in our office ?Please call [facility] at [phone] to set up an appointment to see your surgeon in the office for a follow-up appointment approximately 3-4 weeks after your surgery. ?Make sure that you call for this appointment the day you arrive home to insure a convenient appointment time. ?10. IF YOU HAVE DISABILITY OR FAMILY LEAVE FORMS, BRING THEM TO THE OFFICE FOR PROCESSING.  DO NOT GIVE THEM TO YOUR DOCTOR. ? ? ? ? ?WHEN TO CALL us [phone]: ?Poor pain control ?Reactions / problems with new medications (rash/itching, nausea, etc)  ?Fever over 101.5 F (38.5 C) ?Inability to urinate ?Nausea and/or vomiting ?Worsening swelling or bruising ?Continued bleeding from incision. ?Increased pain, redness, or drainage from the  incision ? ?The clinic staff is available to answer your questions during regular business hours (8:30am-5pm).  Please don?t hesitate to call and ask to speak to one of our nurses for clinical concerns.   A surgeon from [facility] is always on call at the hospitals ?  ?If you have a medical emergency, go to the nearest emergency room or call 911. ?  ? ?[facility], [place] ?[address] [place], [place], [place]  [address] ? ?MAIN: [phone] ? TOLL FREE: [phone] ? ?FAX [phone] ?[email or web address] ? ?  ?

## 2021-06-03 ENCOUNTER — Encounter (HOSPITAL_BASED_OUTPATIENT_CLINIC_OR_DEPARTMENT_OTHER): Payer: Self-pay | Admitting: General Surgery

## 2021-06-03 ENCOUNTER — Telehealth: Payer: Self-pay | Admitting: Family Medicine

## 2021-06-03 LAB — SURGICAL PATHOLOGY

## 2021-06-03 NOTE — Telephone Encounter (Signed)
Pt called and said he had and EKG done at [facility] when he had his surgery done and they said he needs to see a cardiologist so he called them and they said he needs a referral. He wants to go to [place] at [address]. Call back is [phone] ?

## 2021-06-03 NOTE — Telephone Encounter (Signed)
Patient needing to be seen for hospital follow up and for requested referral. Appointment scheduled  ?

## 2021-06-10 ENCOUNTER — Encounter: Payer: Self-pay | Admitting: Family Medicine

## 2021-06-10 ENCOUNTER — Ambulatory Visit (INDEPENDENT_AMBULATORY_CARE_PROVIDER_SITE_OTHER): Payer: Medicare Other | Admitting: Family Medicine

## 2021-06-10 VITALS — BP 124/80 | HR 56 | Temp 97.1°F | Ht 71.0 in | Wt 153.0 lb

## 2021-06-10 DIAGNOSIS — E538 Deficiency of other specified B group vitamins: Secondary | ICD-10-CM | POA: Diagnosis not present

## 2021-06-10 DIAGNOSIS — I447 Left bundle-branch block, unspecified: Secondary | ICD-10-CM | POA: Insufficient documentation

## 2021-06-10 DIAGNOSIS — R972 Elevated prostate specific antigen [PSA]: Secondary | ICD-10-CM | POA: Diagnosis not present

## 2021-06-10 DIAGNOSIS — L739 Follicular disorder, unspecified: Secondary | ICD-10-CM | POA: Insufficient documentation

## 2021-06-10 DIAGNOSIS — E611 Iron deficiency: Secondary | ICD-10-CM

## 2021-06-10 DIAGNOSIS — Z0000 Encounter for general adult medical examination without abnormal findings: Secondary | ICD-10-CM

## 2021-06-10 DIAGNOSIS — I10 Essential (primary) hypertension: Secondary | ICD-10-CM | POA: Insufficient documentation

## 2021-06-10 DIAGNOSIS — R399 Unspecified symptoms and signs involving the genitourinary system: Secondary | ICD-10-CM

## 2021-06-10 MED ORDER — DOXYCYCLINE HYCLATE 100 MG PO TABS: 100.0000 mg | ORAL_TABLET | Freq: Two times a day (BID) | ORAL | 0 refills | Status: AC
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
## 2021-06-11 ENCOUNTER — Other Ambulatory Visit: Payer: Medicare Other

## 2021-06-11 DIAGNOSIS — R399 Unspecified symptoms and signs involving the genitourinary system: Secondary | ICD-10-CM

## 2021-06-11 DIAGNOSIS — R972 Elevated prostate specific antigen [PSA]: Secondary | ICD-10-CM

## 2021-06-11 LAB — COMPREHENSIVE METABOLIC PANEL
ALT: 17 U/L (ref 0–53)
AST: 18 U/L (ref 0–37)
Albumin: 3.9 g/dL (ref 3.5–5.2)
Alkaline Phosphatase: 80 U/L (ref 39–117)
BUN: 14 mg/dL (ref 6–23)
CO2: 30 mEq/L (ref 19–32)
Calcium: 9.4 mg/dL (ref 8.4–10.5)
Chloride: 101 mEq/L (ref 96–112)
Creatinine, Ser: 0.68 mg/dL (ref 0.40–1.50)
GFR: 102.36 mL/min (ref >60.00)
Glucose, Bld: 76 mg/dL (ref 70–99)
Potassium: 4.4 mEq/L (ref 3.5–5.1)
Sodium: 137 mEq/L (ref 135–145)
Total Bilirubin: 0.4 mg/dL (ref 0.2–1.2)
Total Protein: 6.2 g/dL (ref 6.0–8.3)

## 2021-06-11 LAB — IRON,TIBC AND FERRITIN PANEL
%SAT: 21 % (calc) (ref 20–48)
Ferritin: 192 ng/mL (ref 38–380)
Iron: 63 ug/dL (ref 50–180)
TIBC: 299 mcg/dL (calc) (ref 250–425)

## 2021-06-11 LAB — URINALYSIS, ROUTINE W REFLEX MICROSCOPIC
Bilirubin Urine: NEGATIVE
Ketones, ur: NEGATIVE
Nitrite: POSITIVE — AB
Specific Gravity, Urine: 1.02 (ref 1.000–1.030)
Total Protein, Urine: NEGATIVE
Urine Glucose: NEGATIVE
Urobilinogen, UA: 0.2 (ref 0.0–1.0)
pH: 5.5 (ref 5.0–8.0)

## 2021-06-11 LAB — LIPID PANEL
Cholesterol: 151 mg/dL (ref 0–200)
HDL: 53.3 mg/dL (ref >39.00)
LDL Cholesterol: 80 mg/dL (ref 0–99)
NonHDL: 97.35
Total CHOL/HDL Ratio: 3
Triglycerides: 87 mg/dL (ref 0.0–149.0)
VLDL: 17.4 mg/dL (ref 0.0–40.0)

## 2021-06-11 LAB — CBC
HCT: 40.3 % (ref 39.0–52.0)
Hemoglobin: 13.7 g/dL (ref 13.0–17.0)
MCHC: 34.1 g/dL (ref 30.0–36.0)
MCV: 88.9 fl (ref 78.0–100.0)
Platelets: 279 10*3/uL (ref 150.0–400.0)
RBC: 4.53 Mil/uL (ref 4.22–5.81)
RDW: 13.3 % (ref 11.5–15.5)
WBC: 5.2 10*3/uL (ref 4.0–10.5)

## 2021-06-11 LAB — VITAMIN B12: Vitamin B-12: 1039 pg/mL — ABNORMAL HIGH (ref 211–911)

## 2021-06-11 LAB — PSA: PSA: 18.44 ng/mL — ABNORMAL HIGH (ref 0.10–4.00)

## 2021-06-11 NOTE — Addendum Note (Signed)
Addended by: Lynnea Ferrier on: 06/11/2021 01:11 PM ? ? Modules accepted: Orders ? ?

## 2021-06-14 LAB — URINE CULTURE
MICRO NUMBER:: 13128436
SPECIMEN QUALITY:: ADEQUATE
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
## 2021-06-17 ENCOUNTER — Other Ambulatory Visit: Payer: Self-pay

## 2021-06-17 ENCOUNTER — Encounter: Payer: Self-pay | Admitting: Physical Medicine and Rehabilitation

## 2021-06-17 ENCOUNTER — Encounter
Payer: Medicare Other | Attending: Physical Medicine and Rehabilitation | Admitting: Physical Medicine and Rehabilitation

## 2021-06-17 VITALS — BP 123/76 | HR 80 | Ht 71.0 in | Wt 156.0 lb

## 2021-06-17 DIAGNOSIS — S34109S Unspecified injury to unspecified level of lumbar spinal cord, sequela: Secondary | ICD-10-CM | POA: Diagnosis present

## 2021-06-17 DIAGNOSIS — S32008S Other fracture of unspecified lumbar vertebra, sequela: Secondary | ICD-10-CM | POA: Insufficient documentation
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
## 2021-06-18 ENCOUNTER — Ambulatory Visit (INDEPENDENT_AMBULATORY_CARE_PROVIDER_SITE_OTHER): Payer: Medicare Other | Admitting: Cardiovascular Disease

## 2021-06-18 ENCOUNTER — Encounter: Payer: Self-pay | Admitting: Cardiovascular Disease

## 2021-06-18 VITALS — BP 130/77 | HR 66 | Ht 71.0 in | Wt 151.6 lb

## 2021-06-18 DIAGNOSIS — I447 Left bundle-branch block, unspecified: Secondary | ICD-10-CM

## 2021-06-18 DIAGNOSIS — I440 Atrioventricular block, first degree: Secondary | ICD-10-CM | POA: Diagnosis not present

## 2021-06-18 NOTE — Patient Instructions (Signed)
Medication Instructions:  The current medical regimen is effective;  continue present plan and medications.  *If you need a refill on your cardiac medications before your next appointment, please call your pharmacy*   Testing/Procedures: Echocardiogram - Your physician has requested that you have an echocardiogram. Echocardiography is a painless test that uses sound waves to create images of your heart. It provides your doctor with information about the size and shape of your heart and how well your heart's chambers and valves are working. This procedure takes approximately one hour. There are no restrictions for this procedure. This will be performed at either our [address] location - [address], [address]  -or- [facility] location [address] 2nd floor.    Follow-Up: At [facility], you and your health needs are our priority.  As part of our continuing mission to provide you with exceptional heart care, we have created designated Provider Care Teams.  These Care Teams include your primary Cardiologist (physician) and Advanced Practice Providers (APPs -  Physician Assistants and Nurse Practitioners) who all work together to provide you with the care you need, when you need it.  We recommend signing up for the patient portal called "MyChart".  Sign up information is provided on this After Visit Summary.  MyChart is used to connect with patients for Virtual Visits (Telemedicine).  Patients are able to view lab/test results, encounter notes, upcoming appointments, etc.  Non-urgent messages can be sent to your provider as well.   To learn more about what you can do with MyChart, go to [email or web address].    Your next appointment:   As needed  The format for your next appointment:   In Person  Provider:   Davenport O'Neal, MD     

## 2021-06-25 ENCOUNTER — Other Ambulatory Visit: Payer: Self-pay

## 2021-06-25 ENCOUNTER — Ambulatory Visit (HOSPITAL_COMMUNITY): Payer: Medicare Other | Attending: Internal Medicine

## 2021-06-25 DIAGNOSIS — I440 Atrioventricular block, first degree: Secondary | ICD-10-CM

## 2021-06-25 DIAGNOSIS — I447 Left bundle-branch block, unspecified: Secondary | ICD-10-CM

## 2021-06-25 LAB — ECHOCARDIOGRAM COMPLETE
Area-P 1/2: 4.39 cm2
S' Lateral: 4 cm

## 2021-06-28 ENCOUNTER — Telehealth: Payer: Self-pay | Admitting: Cardiovascular Disease

## 2021-06-28 NOTE — Telephone Encounter (Signed)
? ?  Pt saw message from Dr. Audie Box about his echo result and wanted to set up his PET scan that Dr. Audie Box recommended  ?

## 2021-07-01 ENCOUNTER — Other Ambulatory Visit (HOSPITAL_COMMUNITY): Payer: Self-pay | Admitting: Cardiovascular Disease

## 2021-07-01 DIAGNOSIS — I2510 Atherosclerotic heart disease of native coronary artery without angina pectoris: Secondary | ICD-10-CM

## 2021-07-04 ENCOUNTER — Encounter: Payer: Self-pay | Admitting: Family Medicine

## 2021-07-04 ENCOUNTER — Ambulatory Visit (INDEPENDENT_AMBULATORY_CARE_PROVIDER_SITE_OTHER): Payer: Medicare Other | Admitting: Family Medicine

## 2021-07-04 VITALS — BP 122/84 | HR 81 | Temp 98.1°F | Ht 71.0 in | Wt 150.0 lb

## 2021-07-04 DIAGNOSIS — Z87440 Personal history of urinary (tract) infections: Secondary | ICD-10-CM

## 2021-07-04 DIAGNOSIS — R972 Elevated prostate specific antigen [PSA]: Secondary | ICD-10-CM

## 2021-07-04 DIAGNOSIS — R195 Other fecal abnormalities: Secondary | ICD-10-CM

## 2021-07-04 DIAGNOSIS — R8271 Bacteriuria: Secondary | ICD-10-CM | POA: Diagnosis not present

## 2021-07-04 DIAGNOSIS — K5901 Slow transit constipation: Secondary | ICD-10-CM
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
## 2021-07-05 LAB — PSA: PSA: 5.83 ng/mL — ABNORMAL HIGH (ref <=4.00)

## 2021-07-05 LAB — URINALYSIS, ROUTINE W REFLEX MICROSCOPIC
Bilirubin Urine: NEGATIVE
Glucose, UA: NEGATIVE
Ketones, ur: NEGATIVE
Nitrite: NEGATIVE
Specific Gravity, Urine: 1.025 (ref 1.001–1.035)
WBC, UA: >=60 /HPF — AB (ref 0–5)
pH: 5.5 (ref 5.0–8.0)

## 2021-07-05 LAB — MICROSCOPIC MESSAGE

## 2021-07-08 LAB — POC HEMOCCULT BLD/STL (HOME/3-CARD/SCREEN)
Card #2 Fecal Occult Blod, POC: NEGATIVE
Card #3 Fecal Occult Blood, POC: NEGATIVE
Fecal Occult Blood, POC: NEGATIVE

## 2021-07-09 ENCOUNTER — Telehealth (HOSPITAL_COMMUNITY): Payer: Self-pay | Admitting: *Deleted

## 2021-07-09 NOTE — Telephone Encounter (Signed)
Reaching out to patient to offer assistance regarding upcoming cardiac imaging study; pt verbalizes understanding of appt date/time, parking situation and where to check in, pre-test NPO status and verified current allergies; name and call back number provided for further questions should they arise  Ancelmo Hunt RN Navigator Cardiac Imaging [facility] [phone] office [phone] cell  Patient aware to arrive at 9:30am. 

## 2021-07-10 ENCOUNTER — Telehealth: Payer: Self-pay | Admitting: Family Medicine

## 2021-07-10 ENCOUNTER — Encounter (HOSPITAL_COMMUNITY)
Admission: RE | Admit: 2021-07-10 | Discharge: 2021-07-10 | Disposition: A | Discharge status: Home / Self Care | Payer: Medicare Other | Source: Ambulatory Visit | Attending: Cardiovascular Disease | Admitting: Cardiovascular Disease

## 2021-07-10 ENCOUNTER — Other Ambulatory Visit: Payer: Self-pay

## 2021-07-10 ENCOUNTER — Encounter (HOSPITAL_COMMUNITY): Payer: Self-pay

## 2021-07-10 DIAGNOSIS — Z0000 Encounter for general adult medical examination without abnormal findings: Secondary | ICD-10-CM

## 2021-07-10 DIAGNOSIS — I2510 Atherosclerotic heart disease of native coronary artery without angina pectoris: Secondary | ICD-10-CM | POA: Insufficient documentation

## 2021-07-10 NOTE — Telephone Encounter (Signed)
Pt is at [facility] Address: [address], [place], [place] [address] Phone: [phone] needing a shingrix script sent over with a diagnosis attached to it or they will send it back.  ?

## 2021-07-10 NOTE — Telephone Encounter (Signed)
Rx sent 

## 2021-07-22 ENCOUNTER — Telehealth (HOSPITAL_COMMUNITY): Payer: Self-pay | Admitting: Emergency Medicine

## 2021-07-22 NOTE — Telephone Encounter (Signed)
Cancelled CARDIAC PET due to cost ?Marchia Bond RN Navigator Cardiac Imaging ?[facility] ?[phone] Office  ?[phone] Cell ? ?

## 2021-07-23 ENCOUNTER — Encounter (HOSPITAL_COMMUNITY): Payer: Self-pay

## 2021-07-23 ENCOUNTER — Ambulatory Visit (HOSPITAL_COMMUNITY): Payer: Medicare Other

## 2021-08-05 ENCOUNTER — Other Ambulatory Visit (INDEPENDENT_AMBULATORY_CARE_PROVIDER_SITE_OTHER): Payer: Medicare Other

## 2021-08-05 DIAGNOSIS — R972 Elevated prostate specific antigen [PSA]: Secondary | ICD-10-CM | POA: Diagnosis not present

## 2021-08-05 NOTE — Addendum Note (Signed)
Addended by: Jon Billings on: 08/05/2021 04:30 PM ? ? Modules accepted: Orders ? ?

## 2021-08-05 NOTE — Progress Notes (Signed)
Per the orders of  Dr.Kremer pt is here for labs, pt tolerated draw well. ? ?

## 2021-08-06 LAB — URINALYSIS, ROUTINE W REFLEX MICROSCOPIC
Bilirubin Urine: NEGATIVE
Ketones, ur: NEGATIVE
Nitrite: NEGATIVE
Specific Gravity, Urine: 1.025 (ref 1.000–1.030)
Urine Glucose: NEGATIVE
Urobilinogen, UA: 0.2 (ref 0.0–1.0)
pH: 6 (ref 5.0–8.0)

## 2021-08-06 LAB — PSA: PSA: 5.44 ng/mL — ABNORMAL HIGH (ref 0.10–4.00)

## 2021-08-07 LAB — URINE CULTURE
MICRO NUMBER:: 13364894
SPECIMEN QUALITY:: ADEQUATE

## 2021-08-09 ENCOUNTER — Encounter: Payer: Self-pay | Admitting: Family Medicine

## 2021-08-12 MED ORDER — SULFAMETHOXAZOLE-TRIMETHOPRIM 800-160 MG PO TABS: 1.0000 | ORAL_TABLET | Freq: Two times a day (BID) | ORAL | 0 refills | Status: AC

## 2021-08-12 NOTE — Addendum Note (Signed)
Addended by: Jon Billings on: 08/12/2021 07:50 AM ? ? Modules accepted: Orders ? ?

## 2021-08-14 ENCOUNTER — Other Ambulatory Visit: Payer: Self-pay

## 2021-08-14 ENCOUNTER — Telehealth: Payer: Self-pay | Admitting: Family Medicine

## 2021-08-14 DIAGNOSIS — R972 Elevated prostate specific antigen [PSA]: Secondary | ICD-10-CM

## 2021-08-14 DIAGNOSIS — N390 Urinary tract infection, site not specified: Secondary | ICD-10-CM

## 2021-08-14 NOTE — Telephone Encounter (Signed)
Called patient went over labs ?

## 2021-08-14 NOTE — Telephone Encounter (Signed)
Pt had a urinalysis on 5/8. He sees something negative on my chart and would like a call back about the results. ?

## 2021-09-06 ENCOUNTER — Telehealth: Payer: Self-pay

## 2021-09-06 NOTE — Telephone Encounter (Signed)
Pt states Dr Ethelene Hal was going to put in a referral to see a urologist. He said he was told he would get a call around a week later from them. I didn't see a referral in his chart.  He said the Septra antibiotic he was prescribed for a sinus infection worked but now its back. I advised he would need to be seen again. He wants to see if  Dr Ethelene Hal wo;; send another prescription for Septra before he schedules

## 2021-09-06 NOTE — Telephone Encounter (Signed)
Please advise message below patient would like another round of antibiotics sent in for sinus issues offered appointment to see another provider today due to Dr. Ethelene Hal being out but patient asked to wait until Dr. Ethelene Hal returns to see if he would send in.    Also went over referral with patient gave number to call urologist to schedule appointment since referral was already placed and notes sent over.

## 2021-09-09 NOTE — Telephone Encounter (Signed)
Spoke with patient to schedule OV per patient he will see if his symptoms improve, if not he will call back to schedule.

## 2021-09-16 ENCOUNTER — Telehealth: Payer: Self-pay | Admitting: Cardiovascular Disease

## 2021-09-16 NOTE — Telephone Encounter (Signed)
Patient sent MyChart Message requesting appt with Dr. Audie Box. Patient provided the following information. Patient is scheduled to see Dr. Audie Box 09/18/21  1) no 2) the last month but it's very off and on  3) the shortness of breath is usually when I'm not doing anything; could be in bed, driving, etc but not when I'm really active 4) no; just SOB and fatigue    You  [name] 3 hours ago (12:22 PM)   JR Would you mind answering a few other questions for me?    Pt c/o Shortness Of Breath: STAT if SOB developed within the last 24 hours or pt is noticeably SOB on the phone   1. Are you currently SOB ?    2. How long have you been experiencing SOB?    3. Are you SOB when sitting or when up moving around?    4. Are you currently experiencing any other symptoms?  Fatigue      Breckyn Weir  P Cv Div Nl Scheduling 3 hours ago (11:49 AM)    Occasional shortness of breath and general fatigue     You  Rawson Minix 6 hours ago (9:18 AM)   Winifred Olive Morning, I have you scheduled to see Dr. Audie Box  this Wednesday, 09/18/21 at 2:00 pm.   What kind of symptoms are you having? I may need to forward your symptoms to a Triage Nurse

## 2021-09-16 NOTE — Telephone Encounter (Signed)
Spoke with pt regarding shortness of breath and fatigue. Explained to pt that the best course is to keep appointment with Dr. Audie Box on 09/18/21 at 2pm to discuss this. Pt verbalizes understanding.

## 2021-09-18 ENCOUNTER — Ambulatory Visit (INDEPENDENT_AMBULATORY_CARE_PROVIDER_SITE_OTHER): Payer: Medicare Other | Admitting: Cardiovascular Disease

## 2021-09-18 ENCOUNTER — Encounter: Payer: Self-pay | Admitting: Cardiovascular Disease

## 2021-09-18 VITALS — BP 122/82 | HR 70 | Ht 71.0 in | Wt 152.6 lb

## 2021-09-18 DIAGNOSIS — I440 Atrioventricular block, first degree: Secondary | ICD-10-CM

## 2021-09-18 DIAGNOSIS — R0602 Shortness of breath: Secondary | ICD-10-CM | POA: Diagnosis not present

## 2021-09-18 DIAGNOSIS — I5022 Chronic systolic (congestive) heart failure: Secondary | ICD-10-CM

## 2021-09-18 DIAGNOSIS — I447 Left bundle-branch block, unspecified: Secondary | ICD-10-CM | POA: Diagnosis not present

## 2021-09-18 MED ORDER — CARVEDILOL 3.125 MG PO TABS: 3.1250 mg | ORAL_TABLET | Freq: Two times a day (BID) | ORAL | 3 refills | Status: DC

## 2021-09-18 MED ORDER — LOSARTAN POTASSIUM 25 MG PO TABS: 12.5000 mg | ORAL_TABLET | Freq: Every day | ORAL | 3 refills | Status: DC
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
## 2021-09-18 NOTE — Patient Instructions (Signed)
Medication Instructions:  START Coreg 3.125 mg twice daily   Wait 7 days then: START Losartan 12.5 mg daily   *If you need a refill on your cardiac medications before your next appointment, please call your pharmacy*   Lab Work: TSH, BNP today   If you have labs (blood work) drawn today and your tests are completely normal, you will receive your results only by: [place] (if you have MyChart) OR A paper copy in the mail If you have any lab test that is abnormal or we need to change your treatment, we will call you to review the results.   Testing/Procedures: Your physician has requested that you have a lexiscan myoview. For further information please visit [email or web address]. Please follow instruction sheet, as given.    Follow-Up: At [facility], you and your health needs are our priority.  As part of our continuing mission to provide you with exceptional heart care, we have created designated Provider Care Teams.  These Care Teams include your primary Cardiologist (physician) and Advanced Practice Providers (APPs -  Physician Assistants and Nurse Practitioners) who all work together to provide you with the care you need, when you need it.  We recommend signing up for the patient portal called "MyChart".  Sign up information is provided on this After Visit Summary.  MyChart is used to connect with patients for Virtual Visits (Telemedicine).  Patients are able to view lab/test results, encounter notes, upcoming appointments, etc.  Non-urgent messages can be sent to your provider as well.   To learn more about what you can do with MyChart, go to [email or web address].    Your next appointment:   2 month(s)  The format for your next appointment:   In Person  Provider:   Eleonore Chiquito, MD

## 2021-09-19 LAB — BRAIN NATRIURETIC PEPTIDE: BNP: 12 pg/mL (ref 0.0–100.0)

## 2021-09-19 LAB — TSH: TSH: 1.3 u[IU]/mL (ref 0.450–4.500)

## 2021-09-24 ENCOUNTER — Other Ambulatory Visit (HOSPITAL_COMMUNITY): Payer: Medicare Other

## 2021-09-26 ENCOUNTER — Ambulatory Visit (HOSPITAL_COMMUNITY)
Admission: RE | Admit: 2021-09-26 | Discharge: 2021-09-26 | Disposition: A | Discharge status: Home / Self Care | Payer: Medicare Other | Source: Ambulatory Visit | Attending: Cardiology | Admitting: Cardiology

## 2021-09-26 ENCOUNTER — Telehealth (HOSPITAL_COMMUNITY): Payer: Self-pay | Admitting: *Deleted

## 2021-09-26 DIAGNOSIS — R0602 Shortness of breath: Secondary | ICD-10-CM | POA: Insufficient documentation

## 2021-09-26 LAB — MYOCARDIAL PERFUSION IMAGING
LV dias vol: 177 mL (ref 62–150)
LV sys vol: 109 mL
Nuc Stress EF: 38 %
Peak HR: 82 {beats}/min
Rest HR: 56 {beats}/min
Rest Nuclear Isotope Dose: 10.9 mCi
SDS: 3
SRS: 8
SSS: 11
ST Depression (mm): 0 mm
Stress Nuclear Isotope Dose: 32.6 mCi
TID: 1.08

## 2021-09-26 MED ORDER — TECHNETIUM TC 99M TETROFOSMIN IV KIT
32.6000 | PACK | Freq: Once | INTRAVENOUS | Status: AC | PRN
  Administered 2021-09-26: 32.6 via INTRAVENOUS

## 2021-09-26 MED ORDER — REGADENOSON 0.4 MG/5ML IV SOLN
0.4000 mg | Freq: Once | INTRAVENOUS | Status: AC
  Administered 2021-09-26: 0.4 mg via INTRAVENOUS

## 2021-09-26 MED ORDER — TECHNETIUM TC 99M TETROFOSMIN IV KIT
10.9000 | PACK | Freq: Once | INTRAVENOUS | Status: AC | PRN
Start: 2021-09-26 — End: 2021-09-26
  Administered 2021-09-26: 10.9 via INTRAVENOUS

## 2021-09-26 NOTE — Telephone Encounter (Signed)
Close encounter 

## 2021-09-27 ENCOUNTER — Encounter: Payer: Self-pay | Admitting: Cardiovascular Disease

## 2021-09-30 NOTE — Telephone Encounter (Signed)
Looks like he was scheduled for Cardiac PET- he wouldn't need another stress test correct? We should get him set up for the CTA. Just checking.  Thanks!

## 2021-10-02 ENCOUNTER — Other Ambulatory Visit: Payer: Self-pay

## 2021-10-02 DIAGNOSIS — R0602 Shortness of breath: Secondary | ICD-10-CM

## 2021-10-02 DIAGNOSIS — R9439 Abnormal result of other cardiovascular function study: Secondary | ICD-10-CM

## 2021-10-02 DIAGNOSIS — R9430 Abnormal result of cardiovascular function study, unspecified: Secondary | ICD-10-CM

## 2021-10-03 ENCOUNTER — Other Ambulatory Visit: Payer: Self-pay

## 2021-10-03 ENCOUNTER — Encounter: Payer: Self-pay | Admitting: Nurse Practitioner

## 2021-10-03 ENCOUNTER — Ambulatory Visit: Payer: Medicare Other | Admitting: Family Medicine

## 2021-10-03 ENCOUNTER — Ambulatory Visit (INDEPENDENT_AMBULATORY_CARE_PROVIDER_SITE_OTHER): Payer: Medicare Other | Admitting: Nurse Practitioner

## 2021-10-03 VITALS — BP 110/68 | HR 75 | Temp 97.6°F | Ht 71.0 in | Wt 153.4 lb

## 2021-10-03 DIAGNOSIS — R9430 Abnormal result of cardiovascular function study, unspecified: Secondary | ICD-10-CM

## 2021-10-03 DIAGNOSIS — R9439 Abnormal result of other cardiovascular function study: Secondary | ICD-10-CM

## 2021-10-03 DIAGNOSIS — R229 Localized swelling, mass and lump, unspecified: Secondary | ICD-10-CM

## 2021-10-03 DIAGNOSIS — R0602 Shortness of breath: Secondary | ICD-10-CM

## 2021-10-03 MED ORDER — CEPHALEXIN 500 MG PO CAPS: 500.0000 mg | ORAL_CAPSULE | Freq: Three times a day (TID) | ORAL | 0 refills | Status: DC

## 2021-10-03 NOTE — Progress Notes (Signed)
   Acute Office Visit  Subjective:     Patient ID: [name], male    DOB: [birth year], 61 y.o.   MRN: [id number]  Chief Complaint  Patient presents with   Cyst    Pt c/o of possible cyst of R groin area X2 days.    HPI Patient is in today for possible cyst near his right groin for the past 2 days.  He states that he goes to the gym for approximately 3 hours a day and he rides a stationary bike for 30 minutes every day.  He started noticing a walnut sized, hard cyst in his right groin.  He states that it does not hurt, however he does have a history of spinal cord injury and limited sensation in his lower extremities.  He denies fevers, drainage.  He states he has a hard time seeing it from the location of where it is.  ROS See pertinent positives and negatives per HPI.     Objective:    BP 110/68 (BP Location: Right Arm, Patient Position: Sitting, Cuff Size: Normal)   Pulse 75   Temp 97.6 F (36.4 C)   Ht '5\' 11"'$  (1.803 m)   Wt 153 lb 6.4 oz (69.6 kg)   SpO2 96%   BMI 21.39 kg/m    Physical Exam Vitals and nursing note reviewed.  Constitutional:      Appearance: Normal appearance.  HENT:     Head: Normocephalic.  Eyes:     Conjunctiva/sclera: Conjunctivae normal.  Pulmonary:     Effort: Pulmonary effort is normal.  Musculoskeletal:     Cervical back: Normal range of motion.  Skin:    General: Skin is warm.     Comments: 1 cm x 1 cm raised, red area to right groin  Neurological:     General: No focal deficit present.     Mental Status: He is alert and oriented to person, place, and time.  Psychiatric:        Mood and Affect: Mood normal.        Behavior: Behavior normal.        Thought Content: Thought content normal.        Judgment: Judgment normal.       Assessment & Plan:   Problem List Items Addressed This Visit       Other   Lump of skin - Primary    1 cm x 1 cm red, raised area to right groin.  No fluctuance noted.  Consistent with abscess  versus ingrown hair.  Encouraged him to do warm compresses 4-5 times per day.  We will also start Keflex 500 mg 3 times a day for 7 days.  If symptoms are not improving or worsening, reach out and we will place referral to general surgery due to the location.        Meds ordered this encounter  Medications   cephALEXin (KEFLEX) 500 MG capsule    Sig: Take 1 capsule (500 mg total) by mouth 3 (three) times daily.    Dispense:  21 capsule    Refill:  0    Return if symptoms worsen or fail to improve.  Charyl Dancer, NP

## 2021-10-03 NOTE — Assessment & Plan Note (Signed)
1 cm x 1 cm red, raised area to right groin.  No fluctuance noted.  Consistent with abscess versus ingrown hair.  Encouraged him to do warm compresses 4-5 times per day.  We will also start Keflex 500 mg 3 times a day for 7 days.  If symptoms are not improving or worsening, reach out and we will place referral to general surgery due to the location.

## 2021-10-03 NOTE — Patient Instructions (Signed)
It was great to see you!  Start keflex 3 times a day for 7 days. You can also use warm compresses several times per day to the area.   Let's follow-up if your symptoms worsen or don't improve. Send me a message or call on Monday if it is not any better and I will place a referral to surgery.   Take care,  Vance Peper, NP

## 2021-10-04 ENCOUNTER — Telehealth (HOSPITAL_COMMUNITY): Payer: Self-pay | Admitting: Emergency Medicine

## 2021-10-04 NOTE — Telephone Encounter (Signed)
Reaching out to patient to offer assistance regarding upcoming cardiac imaging study; pt verbalizes understanding of appt date/time, parking situation and where to check in, pre-test NPO status and medications ordered, and verified current allergies; name and call back number provided for further questions should they arise Justin Bond RN [facility] [phone] office [phone] cell  Pt states he got labs yesterday at [facility] office. Curious of results. Does not appear to be collected/results pending at this time.  Pt reports has checked his HR at home and pulse 45bpm. Pt will continue taking daily meds as prescribed for CCTA appt Monday.  Denies iv issues Arrival 730

## 2021-10-04 NOTE — Telephone Encounter (Signed)
Asking [name]

## 2021-10-04 NOTE — Telephone Encounter (Signed)
Sacha in lab called over and they are running it now. It should results by tonight or tomorrow. Will notify MD so he can be on the lookout. Thanks!

## 2021-10-07 ENCOUNTER — Encounter (HOSPITAL_COMMUNITY): Payer: Self-pay

## 2021-10-07 ENCOUNTER — Telehealth: Payer: Self-pay

## 2021-10-07 ENCOUNTER — Ambulatory Visit (HOSPITAL_COMMUNITY)
Admission: RE | Admit: 2021-10-07 | Discharge: 2021-10-07 | Disposition: A | Discharge status: Home / Self Care | Payer: Medicare Other | Source: Ambulatory Visit | Attending: Cardiovascular Disease | Admitting: Cardiovascular Disease

## 2021-10-07 DIAGNOSIS — I2510 Atherosclerotic heart disease of native coronary artery without angina pectoris: Secondary | ICD-10-CM | POA: Diagnosis not present

## 2021-10-07 DIAGNOSIS — R9430 Abnormal result of cardiovascular function study, unspecified: Secondary | ICD-10-CM | POA: Diagnosis present

## 2021-10-07 DIAGNOSIS — R0602 Shortness of breath: Secondary | ICD-10-CM | POA: Diagnosis present

## 2021-10-07 DIAGNOSIS — R9439 Abnormal result of other cardiovascular function study: Secondary | ICD-10-CM | POA: Insufficient documentation

## 2021-10-07 MED ORDER — NITROGLYCERIN 0.4 MG SL SUBL
0.8000 mg | SUBLINGUAL_TABLET | Freq: Once | SUBLINGUAL | Status: AC
  Administered 2021-10-07: 0.8 mg via SUBLINGUAL

## 2021-10-07 MED ORDER — NITROGLYCERIN 0.4 MG SL SUBL
SUBLINGUAL_TABLET | SUBLINGUAL | Status: AC
  Filled 2021-10-07: qty 2

## 2021-10-07 MED ORDER — IOHEXOL 350 MG/ML SOLN
100.0000 mL | Freq: Once | INTRAVENOUS | Status: AC | PRN
  Administered 2021-10-07: 100 mL via INTRAVENOUS

## 2021-10-07 NOTE — Telephone Encounter (Signed)
Patient stated imaging took his lab results from March 2023 before performing his procedure. He is forcing fluids today.

## 2021-10-08 ENCOUNTER — Other Ambulatory Visit (HOSPITAL_COMMUNITY): Payer: Medicare Other

## 2021-10-08 ENCOUNTER — Telehealth: Payer: Self-pay | Admitting: Nurse Practitioner

## 2021-10-08 DIAGNOSIS — R229 Localized swelling, mass and lump, unspecified: Secondary | ICD-10-CM

## 2021-10-08 LAB — BASIC METABOLIC PANEL
BUN/Creatinine Ratio: 13 (ref 9–20)
BUN: 11 mg/dL (ref 6–24)
CO2: 24 mmol/L (ref 20–29)
Calcium: 9.8 mg/dL (ref 8.7–10.2)
Chloride: 104 mmol/L (ref 96–106)
Creatinine, Ser: 0.83 mg/dL (ref 0.76–1.27)
Glucose: 105 mg/dL — ABNORMAL HIGH (ref 70–99)
Potassium: 4.8 mmol/L (ref 3.5–5.2)
Sodium: 144 mmol/L (ref 134–144)
eGFR: 101 mL/min/{1.73_m2} (ref >59)

## 2021-10-08 NOTE — Telephone Encounter (Signed)
Caller Name: Dominyck Call back phone #: [phone]  REFERRAL REQUEST Has patient seen PCP for this complaint? Yes.   - - IF NO, is insurance requiring patient see PCP for this issue before PCP can refer them? Yes.   Referral for which specialty: General Surgery Preferred provider/office: [facility] in [place] Reason for referral: cyst not improving - saw Lauren for cyst on 7/6

## 2021-10-08 NOTE — Telephone Encounter (Signed)
Referral placed by provider to Gen Surg. Please allow standard TAT for referral to be complete.
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
## 2021-10-10 ENCOUNTER — Encounter: Payer: Self-pay | Admitting: Cardiovascular Disease

## 2021-10-10 ENCOUNTER — Ambulatory Visit (INDEPENDENT_AMBULATORY_CARE_PROVIDER_SITE_OTHER): Payer: Medicare Other | Admitting: Cardiovascular Disease

## 2021-10-10 ENCOUNTER — Ambulatory Visit (INDEPENDENT_AMBULATORY_CARE_PROVIDER_SITE_OTHER): Payer: Medicare Other

## 2021-10-10 VITALS — BP 120/64 | HR 68 | Ht 71.0 in | Wt 153.8 lb

## 2021-10-10 DIAGNOSIS — R002 Palpitations: Secondary | ICD-10-CM

## 2021-10-10 DIAGNOSIS — I447 Left bundle-branch block, unspecified: Secondary | ICD-10-CM

## 2021-10-10 DIAGNOSIS — I2510 Atherosclerotic heart disease of native coronary artery without angina pectoris: Secondary | ICD-10-CM

## 2021-10-10 DIAGNOSIS — E782 Mixed hyperlipidemia: Secondary | ICD-10-CM | POA: Diagnosis not present

## 2021-10-10 DIAGNOSIS — I5022 Chronic systolic (congestive) heart failure: Secondary | ICD-10-CM

## 2021-10-10 MED ORDER — ATORVASTATIN CALCIUM 10 MG PO TABS: 10.0000 mg | ORAL_TABLET | Freq: Every day | ORAL | 3 refills | Status: DC

## 2021-10-10 MED ORDER — METOPROLOL SUCCINATE ER 25 MG PO TB24: 25.0000 mg | ORAL_TABLET | Freq: Every day | ORAL | 1 refills | Status: DC

## 2021-10-10 NOTE — Progress Notes (Unsigned)
Enrolled for [employer] to mail a ZIO XT long term holter monitor to the patients address on file.  

## 2021-10-10 NOTE — Patient Instructions (Signed)
Medication Instructions:  STOP Carvedilol  START Metoprolol Succinate 25 mg daily at night. START Lipitor 10 mg daily   *If you need a refill on your cardiac medications before your next appointment, please call your pharmacy*   Testing/Procedures: Echocardiogram (3 months, before appointment) - Your physician has requested that you have an echocardiogram. Echocardiography is a painless test that uses sound waves to create images of your heart. It provides your doctor with information about the size and shape of your heart and how well your heart's chambers and valves are working. This procedure takes approximately one hour. There are no restrictions for this procedure.     Follow-Up: At [facility], you and your health needs are our priority.  As part of our continuing mission to provide you with exceptional heart care, we have created designated Provider Care Teams.  These Care Teams include your primary Cardiologist (physician) and Advanced Practice Providers (APPs -  Physician Assistants and Nurse Practitioners) who all work together to provide you with the care you need, when you need it.  We recommend signing up for the patient portal called "MyChart".  Sign up information is provided on this After Visit Summary.  MyChart is used to connect with patients for Virtual Visits (Telemedicine).  Patients are able to view lab/test results, encounter notes, upcoming appointments, etc.  Non-urgent messages can be sent to your provider as well.   To learn more about what you can do with MyChart, go to [email or web address].    Your next appointment:   3 month(s)  The format for your next appointment:   In Person  Provider:   Eleonore Chiquito, MD

## 2021-10-13 DIAGNOSIS — R002 Palpitations: Secondary | ICD-10-CM | POA: Diagnosis not present

## 2021-11-12 ENCOUNTER — Ambulatory Visit: Payer: Medicare Other | Admitting: Family Medicine

## 2021-11-13 ENCOUNTER — Ambulatory Visit: Payer: Medicare Other | Admitting: Cardiovascular Disease

## 2021-11-15 ENCOUNTER — Encounter: Payer: Self-pay | Admitting: Physician Assistant

## 2021-11-15 ENCOUNTER — Ambulatory Visit: Payer: Self-pay

## 2021-11-15 ENCOUNTER — Ambulatory Visit (INDEPENDENT_AMBULATORY_CARE_PROVIDER_SITE_OTHER): Payer: Medicare Other | Admitting: Physician Assistant

## 2021-11-15 ENCOUNTER — Telehealth: Payer: Self-pay | Admitting: Physician Assistant

## 2021-11-15 DIAGNOSIS — M25562 Pain in left knee: Secondary | ICD-10-CM

## 2021-11-15 NOTE — Telephone Encounter (Signed)
Patient called advised his Rx for Maloxicam has expired. Patient asked if the Rx can be sent to the pharmacy?  Patient use [facility] [employer]. The number to contact patient is [phone]
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
## 2021-11-18 ENCOUNTER — Other Ambulatory Visit: Payer: Self-pay | Admitting: Physician Assistant

## 2021-11-18 MED ORDER — MELOXICAM 7.5 MG PO TABS: 7.5000 mg | ORAL_TABLET | Freq: Every day | ORAL | 0 refills | Status: DC

## 2021-12-08 ENCOUNTER — Other Ambulatory Visit: Payer: Self-pay | Admitting: Physician Assistant

## 2021-12-11 ENCOUNTER — Other Ambulatory Visit: Payer: Self-pay | Admitting: Physical Medicine and Rehabilitation

## 2021-12-14 ENCOUNTER — Other Ambulatory Visit: Payer: Self-pay | Admitting: Physical Medicine and Rehabilitation

## 2021-12-31 IMAGING — XA DG MYELOGRAPHY LUMBAR INJ MULTI REGION
12 of 19 series · 12 of 19 positions shown · non-contrast
Comparison: Lumbar myelogram 03/10/2013.  Lumbar MRI 02/20/2013.

CLINICAL DATA: Pain in thoracic spine. Lumbar radiculopathy. Low
back and right leg pain.
TECHNIQUE: Contiguous axial images were obtained through the Thoracic and
Lumbar spine after the intrathecal infusion of infusion. Coronal and
sagittal reconstructions were obtained of the axial image sets.

[Series 1: vasc adipose · 1 of 1 slices shown (1 of 10)]
[im 1/1]
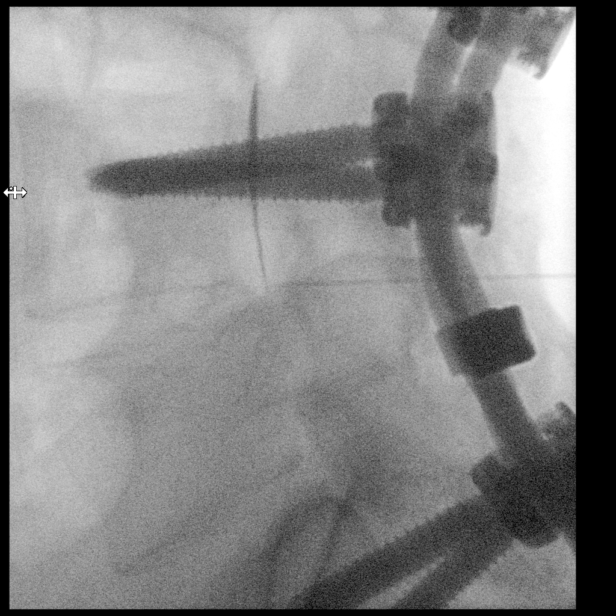

[Series 2: w lumbar spine flexion · 0.15mm/px · 1 of 1 slices shown]
[im 1/1]
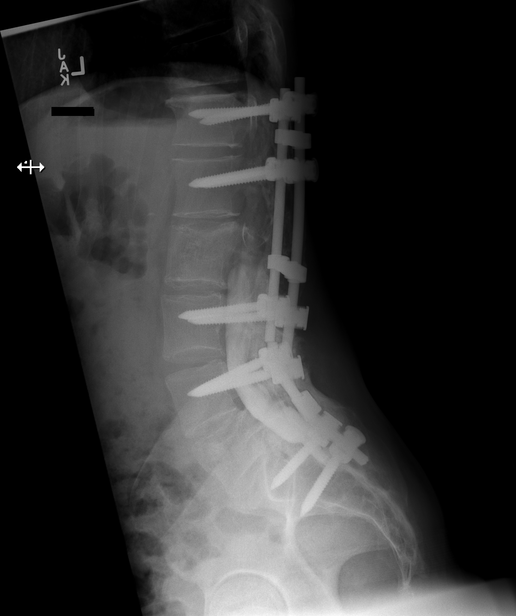

[Series 2: vasc adipose · 1 of 1 slices shown (2 of 10)]
[im 1/1]
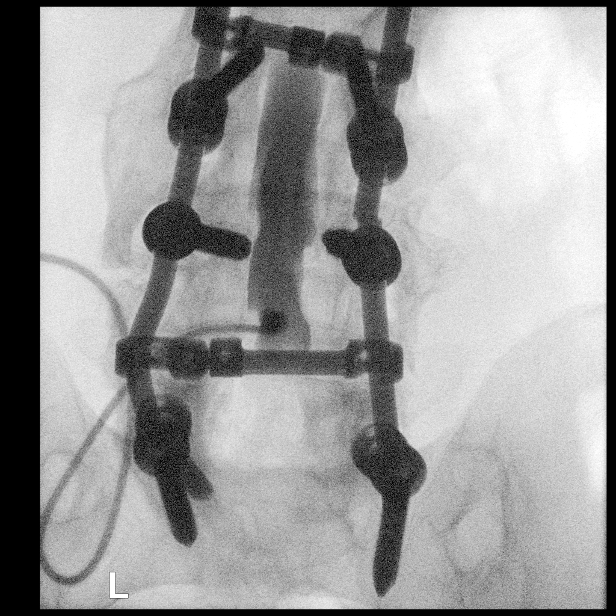

[Series 3: w lumbar spine extension · 0.15mm/px · 1 of 1 slices shown]
[im 1/1]
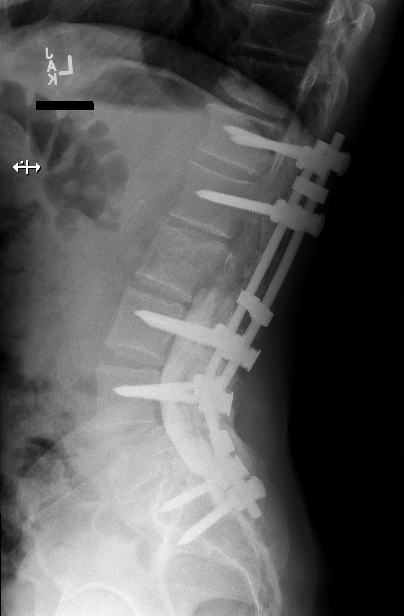

[Series 5: vasc adipose · 1 of 1 slices shown (3 of 10)]
[im 1/1]
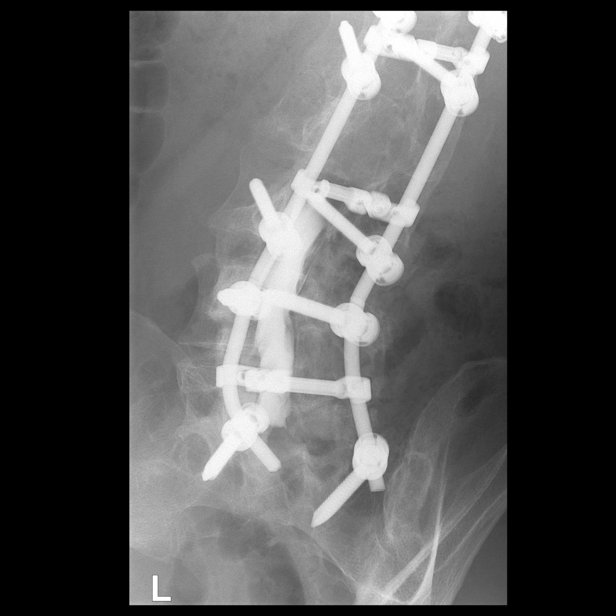

[Series 6: vasc adipose · 1 of 1 slices shown (4 of 10)]
[im 1/1]
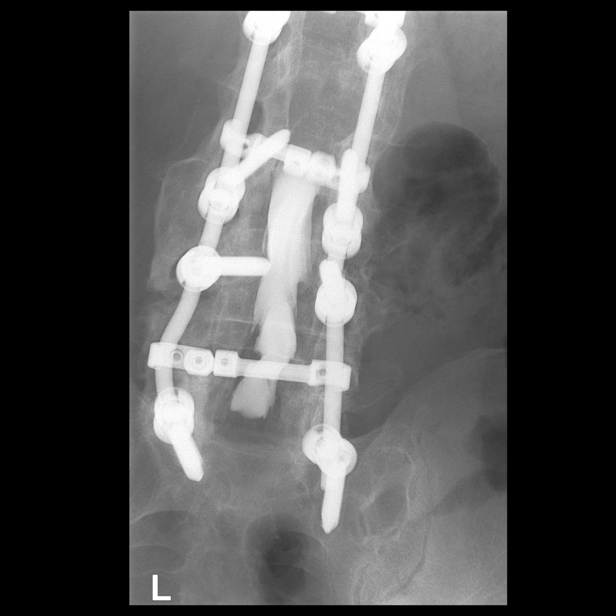

[Series 8: vasc adipose · 1 of 1 slices shown (5 of 10)]
[im 1/1]
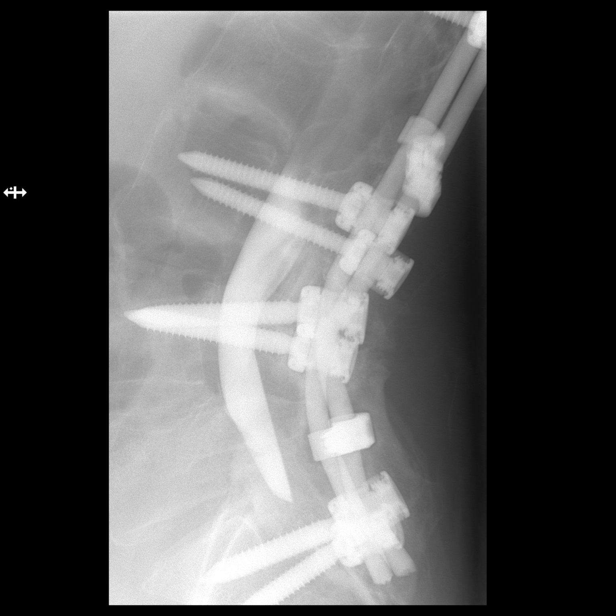

[Series 9: vasc adipose · 1 of 1 slices shown (6 of 10)]
[im 1/1]
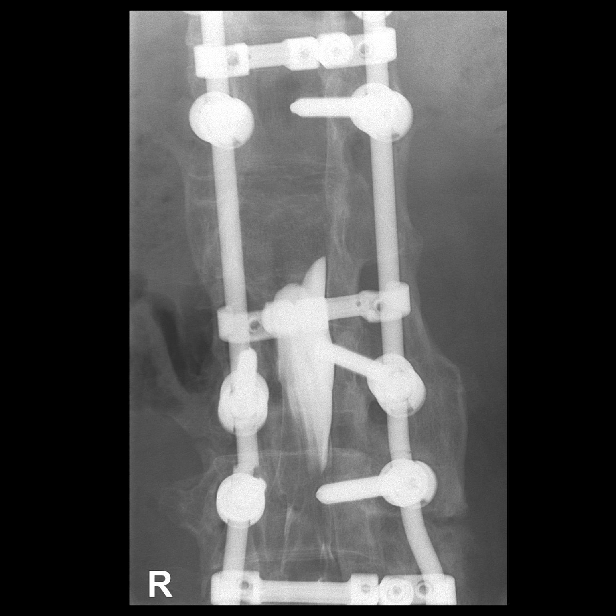

[Series 11: vasc adipose · 1 of 1 slices shown (7 of 10)]
[im 1/1]
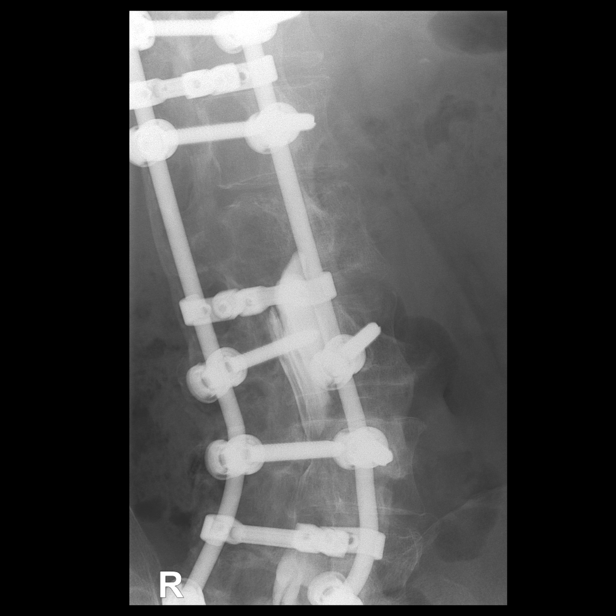

[Series 13: vasc adipose · 1 of 1 slices shown (8 of 10)]
[im 1/1]
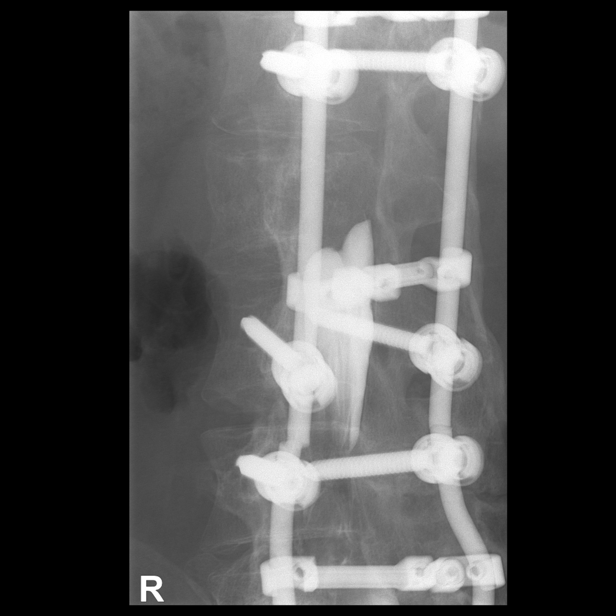

[Series 14: vasc adipose · 1 of 1 slices shown (9 of 10)]
[im 1/1]
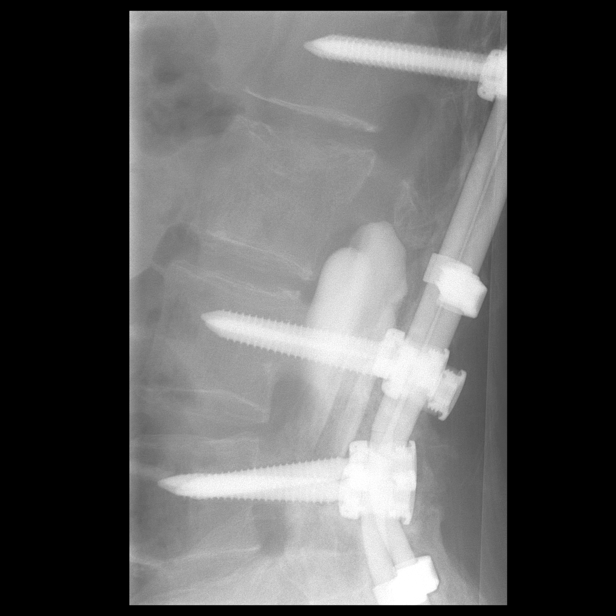

[Series 16: vasc adipose · 1 of 1 slices shown (10 of 10)]
[im 1/1]
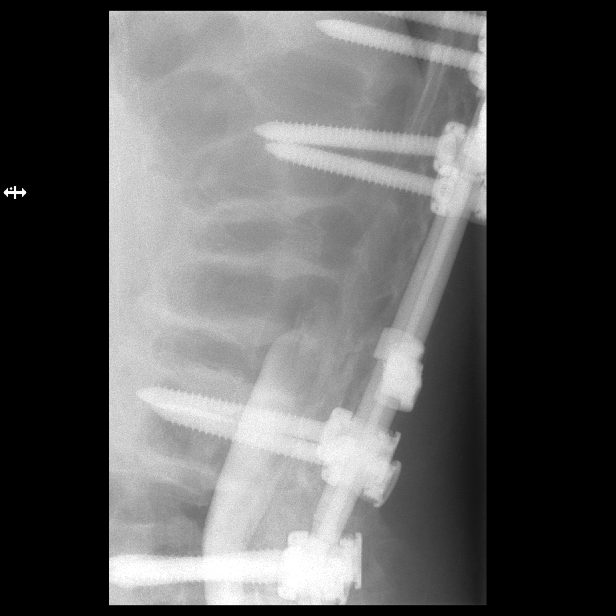

[12 of 19 positions shown; findings below may reference images not displayed]

FLUOROSCOPY TIME:  Fluoroscopy Time: 1 minute 9 seconds

Radiation Exposure Index: 371.96 microGray*m^2

PROCEDURE:
LUMBAR PUNCTURE FOR THORACIC AND LUMBAR MYELOGRAM

After thorough discussion of risks and benefits of the procedure
including bleeding, infection, injury to nerves, blood vessels,
adjacent structures as well as headache and CSF leak, written and
oral informed consent was obtained. Consent was obtained by Dr.
Shawn Jason Rowi.

Patient was positioned prone on the fluoroscopy table. Local
anesthesia was provided with 1% lidocaine without epinephrine after
prepped and draped in the usual sterile fashion. Puncture was
performed at L4 using a 3 1/2 inch 22-gauge spinal needle via a
midline approach. Using a single pass through the dura, the needle
was placed within the thecal sac, with return of clear CSF. 10 mL of
Isovue A-6MM was injected into the thecal sac, with normal
opacification of the nerve roots and cauda equina consistent with
free flow within the subarachnoid space.

I personally performed the lumbar puncture and administered the
intrathecal contrast. I also personally supervised acquisition of
the myelogram images.
FINDINGS: THORACIC AND LUMBAR MYELOGRAM FINDINGS:

There are 5 non rib-bearing lumbar type vertebrae. There is no
listhesis in the lumbar spine including on upright flexion and
extension radiographs. Sequelae T12-S1 posterior fusion are again
identified. Pedicle screws remain in place at T12, L1, L3, L4, and
S1. The interconnecting rods remain fractured bilaterally just
superior to the L4 screws. Widening of the gap in the fractured rods
with flexion and narrowing with extension is less noticeable than on
the prior myelogram, however the patient's range of motion also is
more limited today. No spinal stenosis is evident from L3-S1
although the thecal sac is deformed on the left at L4-5. A contrast
block at the L2 level is similar in appearance to the prior
myelogram.

CT THORACIC MYELOGRAM FINDINGS:

There is mild thoracic dextroscoliosis. There is no significant
listhesis. There are mild T7 and T8 superior endplate compression
fractures without prior studies available for comparison, however
both are chronic in appearance. A 3 cm hemangioma is noted in the
left aspect of the T9 vertebral body. Mild thoracic aortic and left
anterior descending coronary artery atherosclerotic calcification is
noted.

Assessment for disc herniations and spinal stenosis is limited by
the lack of intrathecal contrast in the thoracic spine due to the
block in the lumbar spine. No significant spinal stenosis is
identified within this limitation. There has been previous posterior
decompression from T11 extending into the lumbar spine. Facet
spurring results in moderate left neural foraminal stenosis at T8-9
and T9-10, mild neural foraminal stenosis on the right at T8-9 and
T9-10, and mild neural foraminal stenosis bilaterally at T10-11.
Bulky anterior vertebral osteophytes are noted from T9-T12.

CT LUMBAR MYELOGRAM FINDINGS:

Vertebral alignment is unchanged from the 8591 CT, and there is no
significant listhesis. No acute fracture or interval destructive
process is identified. There are chronic deformities of the L2 and
L5 vertebral bodies with mixed sclerosis and lucency consistent with
the history of remote trauma with burst fractures and osteomyelitis.
Bone graft harvest sites are noted in the posterior [place].

Sequelae of T12-S1 fusion are again identified with pedicle screws
remaining in place bilaterally at T12, L1, L3, L4, and S1. The
interconnecting rods remain fractured bilaterally at L3-4. The right
T12 pedicle screw is again noted to breech the medial margin of the
pedicle and course across the lateral aspect of the spinal canal.
Diffuse lucency about the right S1 screw is unchanged. There is
solid posterolateral osseous fusion from T12-L3 and at L4-5, and
there is also likely fusion on the left at L5-S1. There is chronic
lack of solid posterolateral fusion at L3-4.

There is a contrast block above the mid L2 vertebral body level with
a large filling defect/non-opacified mass expanding the spinal canal
at this level with osseous remodeling, unchanged. More inferiorly in
the lumbar spine, there is a similar appearance of cauda equina
nerve root thickening and clumping consistent with arachnoiditis.

T12-L1: Previous posterior decompression and fusion.  No stenosis.

L1-2: Previous posterior decompression and fusion. Expanded spinal
canal related to the intrathecal mass. No osseous spinal canal or
neural foraminal stenosis.

L2-3: Previous posterior decompression and fusion. Distortion of
cauda equina nerve roots due to arachnoiditis and the intrathecal
mass. No osseous spinal canal or neural foraminal stenosis.

L3-4: Previous posterior decompression and fusion. Unchanged mild
transverse narrowing of the spinal canal due to posterolateral
heterotopic bone. Patent neural foramina.

L4-5: Previous posterior decompression and fusion.  No stenosis.

L5-S1: Previous posterior decompression and fusion. Borderline to
mild bilateral neural foraminal stenosis due to facet spurring. No
spinal stenosis.
IMPRESSION: 1. Unchanged postoperative appearance of the lumbar spine with
chronic fracture of the rods at L3-4, pseudoarthrosis at L3-4, and
loosening of the right S1 screw.
2. Unchanged findings of arachnoiditis and a contrast block at L2
related to an intraspinal mass likely reflecting an
arachnoid/intradural cyst related to prior trauma, surgery, or
infection.
3. No osseous spinal canal or compressive neural foraminal stenosis
in the lumbar spine.
4. Moderate left neural foraminal stenosis at T8-9 and T9-10.
5. Mild chronic T7 and T8 compression fractures.
6. Aortic Atherosclerosis (D6WT4-XU5.5).

## 2021-12-31 IMAGING — CT CT L SPINE W/ CM
1 of 8 series · 5 of 14 positions shown, 7 images · non-contrast
Comparison: Lumbar myelogram 03/10/2013.  Lumbar MRI 02/20/2013.

CLINICAL DATA: Pain in thoracic spine. Lumbar radiculopathy. Low
back and right leg pain.
TECHNIQUE: Contiguous axial images were obtained through the Thoracic and
Lumbar spine after the intrathecal infusion of infusion. Coronal and
sagittal reconstructions were obtained of the axial image sets.

[Series 3: l spine soft · axial · 0.36mm/px · z∈[-596,-416]mm · 5 of 92 slices shown, 7 images]
[im 16/92  soft-tissue]
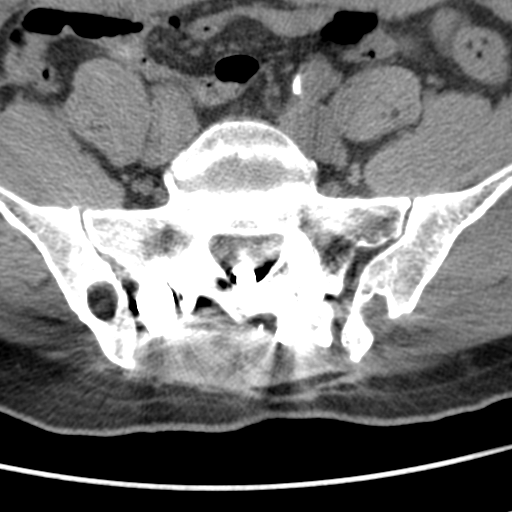
[im 16/92  bone]
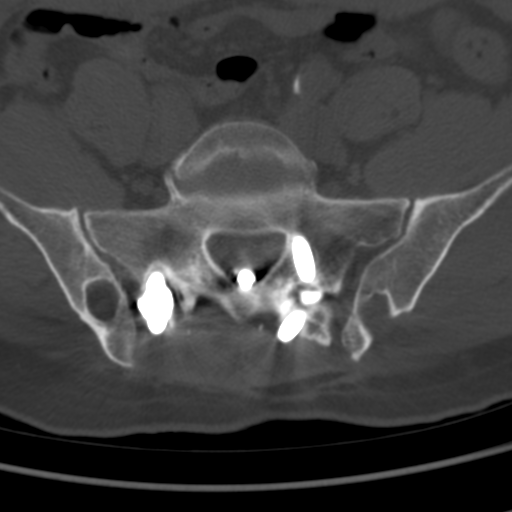
[im 31/92  bone]
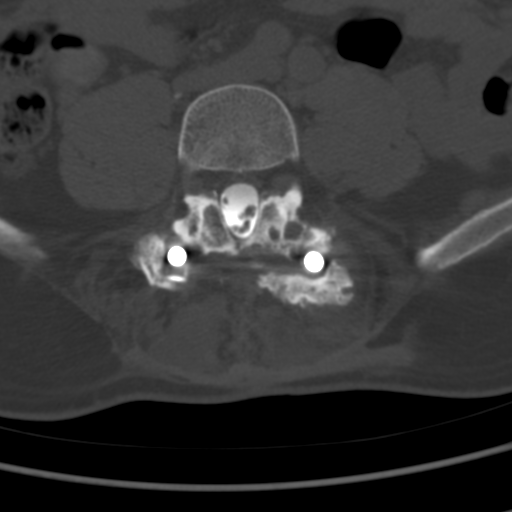
[im 46/92  bone]
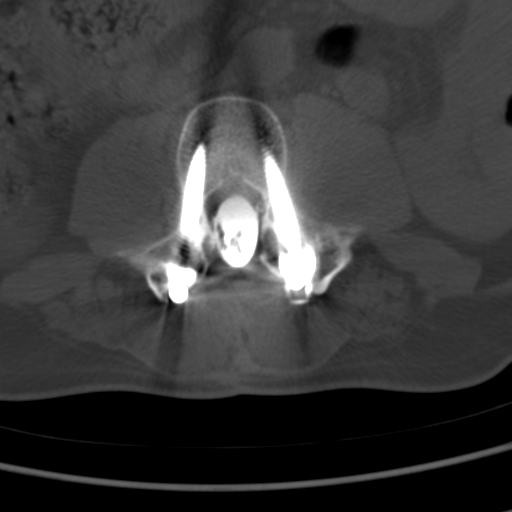
[im 61/92  bone]
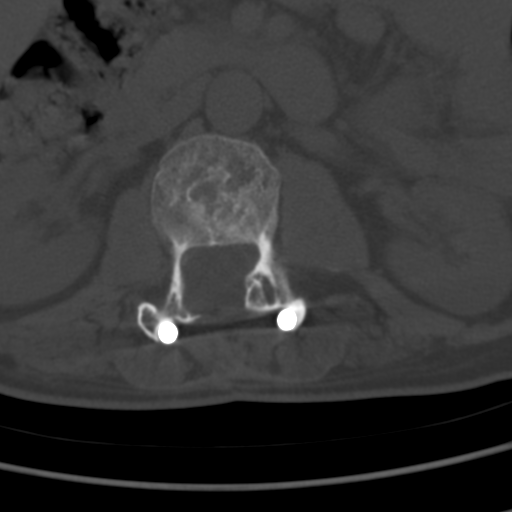
[im 76/92  soft-tissue]
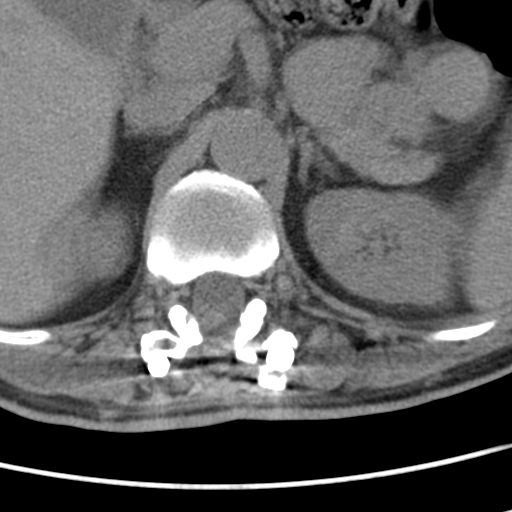
[im 76/92  bone]
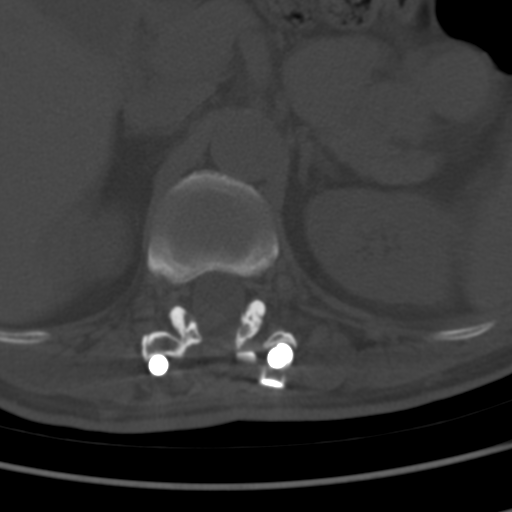

[5 of 14 positions shown; findings below may reference images not displayed]

FLUOROSCOPY TIME:  Fluoroscopy Time: 1 minute 9 seconds

Radiation Exposure Index: 371.96 microGray*m^2

PROCEDURE:
LUMBAR PUNCTURE FOR THORACIC AND LUMBAR MYELOGRAM

After thorough discussion of risks and benefits of the procedure
including bleeding, infection, injury to nerves, blood vessels,
adjacent structures as well as headache and CSF leak, written and
oral informed consent was obtained. Consent was obtained by Dr.
Shawn Jason Rowi.

Patient was positioned prone on the fluoroscopy table. Local
anesthesia was provided with 1% lidocaine without epinephrine after
prepped and draped in the usual sterile fashion. Puncture was
performed at L4 using a 3 1/2 inch 22-gauge spinal needle via a
midline approach. Using a single pass through the dura, the needle
was placed within the thecal sac, with return of clear CSF. 10 mL of
Isovue A-6MM was injected into the thecal sac, with normal
opacification of the nerve roots and cauda equina consistent with
free flow within the subarachnoid space.

I personally performed the lumbar puncture and administered the
intrathecal contrast. I also personally supervised acquisition of
the myelogram images.
FINDINGS: THORACIC AND LUMBAR MYELOGRAM FINDINGS:

There are 5 non rib-bearing lumbar type vertebrae. There is no
listhesis in the lumbar spine including on upright flexion and
extension radiographs. Sequelae T12-S1 posterior fusion are again
identified. Pedicle screws remain in place at T12, L1, L3, L4, and
S1. The interconnecting rods remain fractured bilaterally just
superior to the L4 screws. Widening of the gap in the fractured rods
with flexion and narrowing with extension is less noticeable than on
the prior myelogram, however the patient's range of motion also is
more limited today. No spinal stenosis is evident from L3-S1
although the thecal sac is deformed on the left at L4-5. A contrast
block at the L2 level is similar in appearance to the prior
myelogram.

CT THORACIC MYELOGRAM FINDINGS:

There is mild thoracic dextroscoliosis. There is no significant
listhesis. There are mild T7 and T8 superior endplate compression
fractures without prior studies available for comparison, however
both are chronic in appearance. A 3 cm hemangioma is noted in the
left aspect of the T9 vertebral body. Mild thoracic aortic and left
anterior descending coronary artery atherosclerotic calcification is
noted.

Assessment for disc herniations and spinal stenosis is limited by
the lack of intrathecal contrast in the thoracic spine due to the
block in the lumbar spine. No significant spinal stenosis is
identified within this limitation. There has been previous posterior
decompression from T11 extending into the lumbar spine. Facet
spurring results in moderate left neural foraminal stenosis at T8-9
and T9-10, mild neural foraminal stenosis on the right at T8-9 and
T9-10, and mild neural foraminal stenosis bilaterally at T10-11.
Bulky anterior vertebral osteophytes are noted from T9-T12.

CT LUMBAR MYELOGRAM FINDINGS:

Vertebral alignment is unchanged from the 8591 CT, and there is no
significant listhesis. No acute fracture or interval destructive
process is identified. There are chronic deformities of the L2 and
L5 vertebral bodies with mixed sclerosis and lucency consistent with
the history of remote trauma with burst fractures and osteomyelitis.
Bone graft harvest sites are noted in the posterior [place].

Sequelae of T12-S1 fusion are again identified with pedicle screws
remaining in place bilaterally at T12, L1, L3, L4, and S1. The
interconnecting rods remain fractured bilaterally at L3-4. The right
T12 pedicle screw is again noted to breech the medial margin of the
pedicle and course across the lateral aspect of the spinal canal.
Diffuse lucency about the right S1 screw is unchanged. There is
solid posterolateral osseous fusion from T12-L3 and at L4-5, and
there is also likely fusion on the left at L5-S1. There is chronic
lack of solid posterolateral fusion at L3-4.

There is a contrast block above the mid L2 vertebral body level with
a large filling defect/non-opacified mass expanding the spinal canal
at this level with osseous remodeling, unchanged. More inferiorly in
the lumbar spine, there is a similar appearance of cauda equina
nerve root thickening and clumping consistent with arachnoiditis.

T12-L1: Previous posterior decompression and fusion.  No stenosis.

L1-2: Previous posterior decompression and fusion. Expanded spinal
canal related to the intrathecal mass. No osseous spinal canal or
neural foraminal stenosis.

L2-3: Previous posterior decompression and fusion. Distortion of
cauda equina nerve roots due to arachnoiditis and the intrathecal
mass. No osseous spinal canal or neural foraminal stenosis.

L3-4: Previous posterior decompression and fusion. Unchanged mild
transverse narrowing of the spinal canal due to posterolateral
heterotopic bone. Patent neural foramina.

L4-5: Previous posterior decompression and fusion.  No stenosis.

L5-S1: Previous posterior decompression and fusion. Borderline to
mild bilateral neural foraminal stenosis due to facet spurring. No
spinal stenosis.
IMPRESSION: 1. Unchanged postoperative appearance of the lumbar spine with
chronic fracture of the rods at L3-4, pseudoarthrosis at L3-4, and
loosening of the right S1 screw.
2. Unchanged findings of arachnoiditis and a contrast block at L2
related to an intraspinal mass likely reflecting an
arachnoid/intradural cyst related to prior trauma, surgery, or
infection.
3. No osseous spinal canal or compressive neural foraminal stenosis
in the lumbar spine.
4. Moderate left neural foraminal stenosis at T8-9 and T9-10.
5. Mild chronic T7 and T8 compression fractures.
6. Aortic Atherosclerosis (D6WT4-XU5.5).

## 2022-01-07 ENCOUNTER — Ambulatory Visit (HOSPITAL_COMMUNITY): Payer: Medicare Other | Attending: Cardiovascular Disease

## 2022-01-07 DIAGNOSIS — I447 Left bundle-branch block, unspecified: Secondary | ICD-10-CM | POA: Insufficient documentation

## 2022-01-07 LAB — ECHOCARDIOGRAM COMPLETE
Area-P 1/2: 2.26 cm2
Calc EF: 49.7 %
S' Lateral: 4 cm
Single Plane A2C EF: 51.5 %
Single Plane A4C EF: 46.7 %

## 2022-01-10 ENCOUNTER — Other Ambulatory Visit (HOSPITAL_COMMUNITY): Payer: Medicare Other
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
## 2022-01-16 ENCOUNTER — Encounter: Payer: Self-pay | Admitting: Cardiovascular Disease

## 2022-01-16 ENCOUNTER — Other Ambulatory Visit: Payer: Self-pay | Admitting: Physician Assistant

## 2022-01-16 ENCOUNTER — Ambulatory Visit: Payer: Medicare Other | Attending: Cardiovascular Disease | Admitting: Cardiovascular Disease

## 2022-01-16 VITALS — BP 110/72 | HR 72 | Ht 71.0 in | Wt 157.0 lb

## 2022-01-16 DIAGNOSIS — I5022 Chronic systolic (congestive) heart failure: Secondary | ICD-10-CM | POA: Insufficient documentation

## 2022-01-16 DIAGNOSIS — I2510 Atherosclerotic heart disease of native coronary artery without angina pectoris: Secondary | ICD-10-CM | POA: Diagnosis not present

## 2022-01-16 DIAGNOSIS — I447 Left bundle-branch block, unspecified: Secondary | ICD-10-CM | POA: Diagnosis not present

## 2022-01-16 DIAGNOSIS — E782 Mixed hyperlipidemia: Secondary | ICD-10-CM

## 2022-01-16 MED ORDER — SPIRONOLACTONE 25 MG PO TABS: 12.5000 mg | ORAL_TABLET | Freq: Every day | ORAL | 3 refills | Status: DC

## 2022-01-16 NOTE — Patient Instructions (Signed)
Medication Instructions:  START Aldactone 12.5 mg daily   *If you need a refill on your cardiac medications before your next appointment, please call your pharmacy*   Lab Work: BMET today   If you have labs (blood work) drawn today and your tests are completely normal, you will receive your results only by: [place] (if you have MyChart) OR A paper copy in the mail If you have any lab test that is abnormal or we need to change your treatment, we will call you to review the results.   Follow-Up: At [facility], you and your health needs are our priority.  As part of our continuing mission to provide you with exceptional heart care, we have created designated Provider Care Teams.  These Care Teams include your primary Cardiologist (physician) and Advanced Practice Providers (APPs -  Physician Assistants and Nurse Practitioners) who all work together to provide you with the care you need, when you need it.  We recommend signing up for the patient portal called "MyChart".  Sign up information is provided on this After Visit Summary.  MyChart is used to connect with patients for Virtual Visits (Telemedicine).  Patients are able to view lab/test results, encounter notes, upcoming appointments, etc.  Non-urgent messages can be sent to your provider as well.   To learn more about what you can do with MyChart, go to [email or web address].    Your next appointment:   4 month(s)  The format for your next appointment:   In Person  Provider:   Eleonore Chiquito, MD

## 2022-01-17 LAB — BASIC METABOLIC PANEL
BUN/Creatinine Ratio: 19 (ref 9–20)
BUN: 15 mg/dL (ref 6–24)
CO2: 25 mmol/L (ref 20–29)
Calcium: 9.2 mg/dL (ref 8.7–10.2)
Chloride: 102 mmol/L (ref 96–106)
Creatinine, Ser: 0.81 mg/dL (ref 0.76–1.27)
Glucose: 82 mg/dL (ref 70–99)
Potassium: 4.3 mmol/L (ref 3.5–5.2)
Sodium: 141 mmol/L (ref 134–144)
eGFR: 102 mL/min/{1.73_m2} (ref >59)

## 2022-01-17 MED ORDER — EMPAGLIFLOZIN 10 MG PO TABS: 10.0000 mg | ORAL_TABLET | Freq: Every day | ORAL | 3 refills | Status: DC

## 2022-01-27 ENCOUNTER — Encounter: Payer: Self-pay | Admitting: Cardiovascular Disease

## 2022-01-27 DIAGNOSIS — I447 Left bundle-branch block, unspecified: Secondary | ICD-10-CM

## 2022-01-27 DIAGNOSIS — I5022 Chronic systolic (congestive) heart failure: Secondary | ICD-10-CM

## 2022-01-27 NOTE — Telephone Encounter (Signed)
Referral placed.

## 2022-02-13 ENCOUNTER — Other Ambulatory Visit: Payer: Self-pay | Admitting: Physical Medicine and Rehabilitation

## 2022-03-18 ENCOUNTER — Other Ambulatory Visit: Payer: Self-pay | Admitting: Physician Assistant

## 2022-03-19 ENCOUNTER — Other Ambulatory Visit: Payer: Self-pay | Admitting: Physician Assistant

## 2022-03-21 ENCOUNTER — Other Ambulatory Visit: Payer: Self-pay | Admitting: Physician Assistant

## 2022-03-28 ENCOUNTER — Other Ambulatory Visit: Payer: Self-pay | Admitting: Physical Medicine and Rehabilitation

## 2022-03-28 ENCOUNTER — Telehealth: Payer: Self-pay | Admitting: Physical Medicine and Rehabilitation

## 2022-03-28 NOTE — Telephone Encounter (Signed)
Patient is in [place] and is requesting medication transfer for Gabapentin and Robaxin to be sent to [place]., pharmacy number is [phone].

## 2022-04-02 ENCOUNTER — Telehealth: Payer: Self-pay | Admitting: Cardiovascular Disease

## 2022-04-02 ENCOUNTER — Other Ambulatory Visit: Payer: Self-pay | Admitting: Physical Medicine and Rehabilitation

## 2022-04-02 ENCOUNTER — Telehealth: Payer: Self-pay | Admitting: Physical Medicine and Rehabilitation

## 2022-04-02 MED ORDER — GABAPENTIN 600 MG PO TABS: ORAL_TABLET | ORAL | 0 refills | Status: DC

## 2022-04-02 MED ORDER — LIDOCAINE 5 % EX PTCH: 1.0000 | MEDICATED_PATCH | CUTANEOUS | 0 refills | Status: DC

## 2022-04-02 MED ORDER — METHOCARBAMOL 500 MG PO TABS: 500.0000 mg | ORAL_TABLET | Freq: Four times a day (QID) | ORAL | 0 refills | Status: DC

## 2022-04-02 NOTE — Telephone Encounter (Signed)
Patient called and states he was unable to get rx for gabapentin and lidocaine patches said prior authorization is needed

## 2022-04-02 NOTE — Telephone Encounter (Signed)
*  STAT* If patient is at the pharmacy, call can be transferred to refill team.   1. Which medications need to be refilled? (please list name of each medication and dose if known) metoprolol succinate (TOPROL XL) 25 MG 24 hr tablet   2. Which pharmacy/location (including street and city if local pharmacy) is medication to be sent to?  [place] ([place]), [place] - [id number]    3. Do they need a 30 day or 90 day supply? [place]

## 2022-04-02 NOTE — Telephone Encounter (Signed)
Please send refill for Lidocaine 5% patches (current has none on hand), Gabapentin 600 MG & Robaxin  500 MG.  Please send  to [facility] in  [place]. ASAP.   Call back phone [phone]

## 2022-04-03 MED ORDER — METOPROLOL SUCCINATE ER 25 MG PO TB24: 25.0000 mg | ORAL_TABLET | Freq: Every day | ORAL | 0 refills | Status: DC

## 2022-04-03 NOTE — Telephone Encounter (Signed)
PA approved for gabapentin 03/31/22-03/31/23 PA# [id number]

## 2022-04-03 NOTE — Telephone Encounter (Signed)
Prescription sent to patient preferred pharmacy.   Per Dr. Kathalene Frames last OV note- Currently on metoprolol succinate 25 mg daily and losartan 12.5 mg daily.  He is extremely active.  His blood pressure is low at baseline.  We will add Aldactone to see if we can intensify his medical therapy.  We discussed likely need for CRT-P.  For now he would like to continue with medical therapy given his lack of symptoms.  We will see how he does and I will see him back in 4 to 5 months. -We will check a BMP today to make sure potassium is not prohibitive for Aldactone.

## 2022-04-03 NOTE — Telephone Encounter (Signed)
PA submitted through cover my meds for gabapentin

## 2022-04-07 NOTE — Telephone Encounter (Signed)
PA approved for Lidocaine patch, approved through 03/31/23
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
## 2022-04-14 ENCOUNTER — Ambulatory Visit: Payer: Medicare HMO | Attending: Cardiology | Admitting: Cardiology

## 2022-04-14 ENCOUNTER — Encounter: Payer: Self-pay | Admitting: Cardiology

## 2022-04-14 VITALS — BP 120/72 | HR 59 | Ht 71.0 in | Wt 148.0 lb

## 2022-04-14 DIAGNOSIS — I2510 Atherosclerotic heart disease of native coronary artery without angina pectoris: Secondary | ICD-10-CM

## 2022-04-14 DIAGNOSIS — I425 Other restrictive cardiomyopathy: Secondary | ICD-10-CM

## 2022-04-14 DIAGNOSIS — I447 Left bundle-branch block, unspecified: Secondary | ICD-10-CM | POA: Diagnosis not present

## 2022-04-14 DIAGNOSIS — I5022 Chronic systolic (congestive) heart failure: Secondary | ICD-10-CM
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
## 2022-04-14 NOTE — Patient Instructions (Signed)
Medication Instructions:  Your physician recommends that you continue on your current medications as directed. Please refer to the Current Medication list given to you today.  *If you need a refill on your cardiac medications before your next appointment, please call your pharmacy*   Lab Work: None ordered   Testing/Procedures: Your physician has requested that you have a cardiac MRI. Cardiac MRI uses a computer to create images of your heart as its beating, producing both still and moving pictures of your heart and major blood vessels. For further information please visit [email or web address] Please follow the instruction sheet given to you today for more information.   Follow-Up: At [facility], you and your health needs are our priority.  As part of our continuing mission to provide you with exceptional heart care, we have created designated Provider Care Teams.  These Care Teams include your primary Cardiologist (physician) and Advanced Practice Providers (APPs -  Physician Assistants and Nurse Practitioners) who all work together to provide you with the care you need, when you need it.  Your next appointment:   8 week(s)    (after you have completed your cardiac MRI)  The format for your next appointment:   In Person  Provider:   Lars Mage, MD    Thank you for choosing [facility]!!   [phone]  Other Instructions

## 2022-04-18 DIAGNOSIS — R972 Elevated prostate specific antigen [PSA]: Secondary | ICD-10-CM | POA: Diagnosis not present

## 2022-04-23 DIAGNOSIS — H5213 Myopia, bilateral: Secondary | ICD-10-CM | POA: Diagnosis not present

## 2022-05-09 ENCOUNTER — Ambulatory Visit: Payer: Medicare Other | Admitting: Cardiovascular Disease

## 2022-05-28 ENCOUNTER — Ambulatory Visit: Admission: RE | Admit: 2022-05-28 | Payer: Medicare HMO | Source: Ambulatory Visit

## 2022-06-02 ENCOUNTER — Telehealth (HOSPITAL_COMMUNITY): Payer: Self-pay | Admitting: *Deleted

## 2022-06-02 NOTE — Telephone Encounter (Signed)
Reaching out to patient to offer assistance regarding upcoming cardiac imaging study; pt verbalizes understanding of appt date/time, parking situation and where to check in, and verified current allergies; name and call back number provided for further questions should they arise  Gordy Clement RN Navigator Cardiac Imaging [facility] [phone] office [phone] cell  Patient reports having an MRI in the past without incident. Has metal rods in his back but they do not react with the magnet.

## 2022-06-04 ENCOUNTER — Other Ambulatory Visit: Payer: Self-pay | Admitting: Cardiology

## 2022-06-04 ENCOUNTER — Ambulatory Visit
Admission: RE | Admit: 2022-06-04 | Discharge: 2022-06-04 | Disposition: A | Discharge status: Home / Self Care | Payer: Medicare HMO | Source: Ambulatory Visit | Attending: Cardiology | Admitting: Cardiology

## 2022-06-04 DIAGNOSIS — I5022 Chronic systolic (congestive) heart failure: Secondary | ICD-10-CM | POA: Diagnosis not present

## 2022-06-04 DIAGNOSIS — I425 Other restrictive cardiomyopathy: Secondary | ICD-10-CM

## 2022-06-04 MED ORDER — GADOBUTROL 1 MMOL/ML IV SOLN
10.0000 mL | Freq: Once | INTRAVENOUS | Status: AC | PRN
  Administered 2022-06-04: 10 mL via INTRAVENOUS

## 2022-06-10 ENCOUNTER — Ambulatory Visit: Payer: Medicare HMO | Attending: Cardiology | Admitting: Cardiology

## 2022-06-10 ENCOUNTER — Encounter: Payer: Self-pay | Admitting: Cardiology

## 2022-06-10 VITALS — BP 118/60 | HR 71 | Ht 71.0 in | Wt 144.8 lb

## 2022-06-10 DIAGNOSIS — I5022 Chronic systolic (congestive) heart failure: Secondary | ICD-10-CM | POA: Diagnosis not present

## 2022-06-10 NOTE — Patient Instructions (Signed)
Medication Instructions:  Your physician recommends that you continue on your current medications as directed. Please refer to the Current Medication list given to you today.  *If you need a refill on your cardiac medications before your next appointment, please call your pharmacy*  Follow-Up: At [facility], you and your health needs are our priority.  As part of our continuing mission to provide you with exceptional heart care, we have created designated Provider Care Teams.  These Care Teams include your primary Cardiologist (physician) and Advanced Practice Providers (APPs -  Physician Assistants and Nurse Practitioners) who all work together to provide you with the care you need, when you need it.  Your next appointment:   As needed with Dr. Lambert 

## 2022-06-10 NOTE — Progress Notes (Signed)
  Electrophysiology Office Follow up Visit Note:    Date:  06/10/2022   ID:  [name], DOB Feb 04, 1961, MRN [id number]  PCP:  Libby Maw, MD  [facility] Cardiologist:  None  [facility] Electrophysiologist:  Vickie Epley, MD    Interval History:    [name] is a 62 y.o. male who presents for a follow up visit.   I last saw the patient April 15, 4191 for systolic heart failure left bundle branch block.  At the last appointment he reported NYHA class I symptoms and was very active.  His QRS duration on EKG was 158 ms.  His EF by echo was 35 to 40%.  A cardiac MRI was recommended.   He has been doing well since I last saw him.  No lightheadedness or dizziness.  Continues to exercise.    Past medical, surgical, social and family history were reviewed.  ROS:   Please see the history of present illness.    All other systems reviewed and are negative.  EKGs/Labs/Other Studies Reviewed:    The following studies were reviewed today:  June 04, 2022 cardiac MRI shows left ventricular ejection fraction 41%.  No LGE.  Global hypokinesis without any regional wall motion abnormalities.     Physical Exam:    VS:  BP 118/60   Pulse 71   Ht 5\' 11"  (1.803 m)   Wt 144 lb 12.8 oz (65.7 kg)   SpO2 99%   BMI 20.20 kg/m     Wt Readings from Last 3 Encounters:  06/10/22 144 lb 12.8 oz (65.7 kg)  04/14/22 148 lb (67.1 kg)  01/16/22 157 lb (71.2 kg)     GEN:  Well nourished, well developed in no acute distress CARDIAC: Warm, nonedematous extremities.      ASSESSMENT:    1. Chronic systolic heart failure (HCC)    PLAN:    In order of problems listed above:   #Chronic systolic heart failure NYHA class I.  Warm and dry on exam.  EF 41% by MRI. At this time, I do not think there is an indication for CRT given his ejection fraction and functional status.  I would recommend close follow-up with Dr. Audie Box, titration of GDMT as tolerated.  If his  EF were to decline in the future and he display symptoms associated with reduced ejection fraction, could revisit implant of a CRT-D.   Follow-up with EP on an as-needed basis   Signed, Lars Mage, MD, [facility] 06/10/2022 4:12 PM    Electrophysiology [facility]

## 2022-06-12 DIAGNOSIS — G8921 Chronic pain due to trauma: Secondary | ICD-10-CM | POA: Diagnosis not present

## 2022-06-12 DIAGNOSIS — F419 Anxiety disorder, unspecified: Secondary | ICD-10-CM | POA: Diagnosis not present

## 2022-06-12 DIAGNOSIS — F331 Major depressive disorder, recurrent, moderate: Secondary | ICD-10-CM | POA: Diagnosis not present

## 2022-06-12 DIAGNOSIS — I440 Atrioventricular block, first degree: Secondary | ICD-10-CM | POA: Diagnosis not present

## 2022-06-12 DIAGNOSIS — I447 Left bundle-branch block, unspecified: Secondary | ICD-10-CM | POA: Diagnosis not present

## 2022-06-16 ENCOUNTER — Other Ambulatory Visit: Payer: Self-pay

## 2022-06-16 ENCOUNTER — Telehealth: Payer: Self-pay | Admitting: Cardiovascular Disease

## 2022-06-16 ENCOUNTER — Other Ambulatory Visit: Payer: Self-pay | Admitting: Physical Medicine and Rehabilitation

## 2022-06-16 MED ORDER — LOSARTAN POTASSIUM 25 MG PO TABS: 12.5000 mg | ORAL_TABLET | Freq: Every day | ORAL | 1 refills | Status: DC

## 2022-06-16 MED ORDER — EMPAGLIFLOZIN 10 MG PO TABS: 10.0000 mg | ORAL_TABLET | Freq: Every day | ORAL | 1 refills | Status: DC

## 2022-06-16 MED ORDER — ATORVASTATIN CALCIUM 10 MG PO TABS: 10.0000 mg | ORAL_TABLET | Freq: Every day | ORAL | 1 refills | Status: DC

## 2022-06-16 MED ORDER — SPIRONOLACTONE 25 MG PO TABS: 25.0000 mg | ORAL_TABLET | Freq: Every day | ORAL | 1 refills | Status: DC

## 2022-06-16 MED ORDER — METOPROLOL SUCCINATE ER 25 MG PO TB24: 25.0000 mg | ORAL_TABLET | Freq: Every day | ORAL | 1 refills | Status: DC

## 2022-06-16 NOTE — Telephone Encounter (Signed)
Pt c/o medication issue:  1. Name of Medication: spironolactone (ALDACTONE) 25 MG tablet   2. How are you currently taking this medication (dosage and times per day)? Take 0.5 tablets (12.5 mg total) by mouth daily.   3. Are you having a reaction (difficulty breathing--STAT)? no  4. What is your medication issue? Pt states he's not able to break this medication in half, he needs another prescription so that he can only take one pill.    Pt c/o medication issue:  1. Name of Medication: empagliflozin (JARDIANCE) 10 MG TABS tablet   2. How are you currently taking this medication (dosage and times per day)? Take 1 tablet (10 mg total) by mouth daily before breakfast.   3. Are you having a reaction (difficulty breathing--STAT)? no  4. What is your medication issue? Pt states he would like to change this medication to 90 day supply instead of 30 day supply if possibly.    Pt would like a callback regarding [place] going [place] and needs a vacation override for 4 months on listed Medications: empagliflozin (JARDIANCE) 10 MG TABS tablet, spironolactone (ALDACTONE), atorvastatin (LIPITOR) 10 MG tablet, losartan (COZAAR) 25 MG tablet, metoprolol succinate (TOPROL XL) 25 MG 24 hr tablet.   Please advise.

## 2022-06-16 NOTE — Telephone Encounter (Signed)
Called patient, he states he is unable to cut spironolactone to 12.5 mg daily- advised that they did not make a 12.5 mg tablet, patient would like to increase to the 25 mg daily tablet. Advised I would have to check with MD.  Patient recent BP with EP on 03/12 was 118/60 HR 71.   I can send over all other medications once we get recommendations on the Spironolactone.  Patient verbalized understanding.

## 2022-06-16 NOTE — Telephone Encounter (Signed)
RX sent to verified pharmacy.    RX updated.   Thanks!

## 2022-06-23 ENCOUNTER — Encounter: Payer: Medicare HMO | Attending: Physical Medicine and Rehabilitation | Admitting: Physical Medicine and Rehabilitation

## 2022-06-23 VITALS — BP 116/71 | HR 65 | Ht 71.0 in | Wt 148.0 lb

## 2022-06-23 DIAGNOSIS — S32008S Other fracture of unspecified lumbar vertebra, sequela: Secondary | ICD-10-CM | POA: Insufficient documentation

## 2022-06-23 DIAGNOSIS — S32039D Unspecified fracture of third lumbar vertebra, subsequent encounter for fracture with routine healing: Secondary | ICD-10-CM

## 2022-06-23 DIAGNOSIS — I5020 Unspecified systolic (congestive) heart failure: Secondary | ICD-10-CM | POA: Diagnosis not present

## 2022-06-23 DIAGNOSIS — M48062 Spinal stenosis, lumbar region with neurogenic claudication: Secondary | ICD-10-CM

## 2022-06-23 DIAGNOSIS — S34109S Unspecified injury to unspecified level of lumbar spinal cord, sequela: Secondary | ICD-10-CM | POA: Insufficient documentation

## 2022-06-23 MED ORDER — METHOCARBAMOL 500 MG PO TABS: 500.0000 mg | ORAL_TABLET | Freq: Four times a day (QID) | ORAL | 0 refills | Status: DC

## 2022-06-23 MED ORDER — GABAPENTIN 600 MG PO TABS: 1200.0000 mg | ORAL_TABLET | Freq: Four times a day (QID) | ORAL | 6 refills | Status: DC
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
## 2022-07-17 ENCOUNTER — Other Ambulatory Visit (HOSPITAL_COMMUNITY): Payer: Medicare HMO

## 2022-09-25 ENCOUNTER — Ambulatory Visit: Payer: Self-pay | Admitting: Cardiovascular Disease

## 2022-10-08 ENCOUNTER — Ambulatory Visit: Payer: Self-pay | Admitting: Cardiovascular Disease

## 2022-10-13 DIAGNOSIS — G8921 Chronic pain due to trauma: Secondary | ICD-10-CM | POA: Diagnosis not present

## 2022-10-13 DIAGNOSIS — F419 Anxiety disorder, unspecified: Secondary | ICD-10-CM | POA: Diagnosis not present

## 2022-10-13 DIAGNOSIS — F331 Major depressive disorder, recurrent, moderate: Secondary | ICD-10-CM | POA: Diagnosis not present

## 2022-10-13 DIAGNOSIS — I447 Left bundle-branch block, unspecified: Secondary | ICD-10-CM | POA: Diagnosis not present

## 2022-10-13 DIAGNOSIS — I440 Atrioventricular block, first degree: Secondary | ICD-10-CM | POA: Diagnosis not present
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
## 2022-10-21 ENCOUNTER — Ambulatory Visit: Payer: Medicare HMO | Attending: Cardiovascular Disease | Admitting: Cardiovascular Disease

## 2022-10-21 ENCOUNTER — Encounter: Payer: Self-pay | Admitting: Cardiovascular Disease

## 2022-10-21 VITALS — BP 110/68 | HR 58 | Ht 71.0 in | Wt 146.4 lb

## 2022-10-21 DIAGNOSIS — E782 Mixed hyperlipidemia: Secondary | ICD-10-CM | POA: Diagnosis not present

## 2022-10-21 DIAGNOSIS — I5022 Chronic systolic (congestive) heart failure: Secondary | ICD-10-CM | POA: Diagnosis not present

## 2022-10-21 DIAGNOSIS — I2510 Atherosclerotic heart disease of native coronary artery without angina pectoris: Secondary | ICD-10-CM | POA: Diagnosis not present

## 2022-10-21 DIAGNOSIS — I447 Left bundle-branch block, unspecified: Secondary | ICD-10-CM | POA: Diagnosis not present

## 2022-10-21 MED ORDER — SACUBITRIL-VALSARTAN 24-26 MG PO TABS: 1.0000 | ORAL_TABLET | Freq: Two times a day (BID) | ORAL | 3 refills | Status: DC

## 2022-10-21 MED ORDER — ATORVASTATIN CALCIUM 10 MG PO TABS: 10.0000 mg | ORAL_TABLET | Freq: Every day | ORAL | 3 refills | Status: DC

## 2022-10-21 MED ORDER — SACUBITRIL-VALSARTAN 24-26 MG PO TABS: 1.0000 | ORAL_TABLET | Freq: Two times a day (BID) | ORAL | 0 refills | Status: DC

## 2022-10-21 MED ORDER — EMPAGLIFLOZIN 10 MG PO TABS: 10.0000 mg | ORAL_TABLET | Freq: Every day | ORAL | 3 refills | Status: DC

## 2022-10-21 MED ORDER — SPIRONOLACTONE 25 MG PO TABS: 25.0000 mg | ORAL_TABLET | Freq: Every day | ORAL | 3 refills | Status: DC

## 2022-10-21 MED ORDER — METOPROLOL SUCCINATE ER 25 MG PO TB24: 25.0000 mg | ORAL_TABLET | Freq: Every day | ORAL | 3 refills | Status: DC

## 2022-10-21 NOTE — Patient Instructions (Signed)
Medication Instructions:  Your physician has recommended you make the following change in your medication:   -Stop losartan (cozaar)  -Start Entresto 24-26mg  twice daily.  *If you need a refill on your cardiac medications before your next appointment, please call your pharmacy*   Lab Work: Your physician recommends that you have labs drawn today: BMET  If you have labs (blood work) drawn today and your tests are completely normal, you will receive your results only by: MyChart Message (if you have MyChart) OR A paper copy in the mail If you have any lab test that is abnormal or we need to change your treatment, we will call you to review the results.   Follow-Up: At [facility], you and your health needs are our priority.  As part of our continuing mission to provide you with exceptional heart care, we have created designated Provider Care Teams.  These Care Teams include your primary Cardiologist (physician) and Advanced Practice Providers (APPs -  Physician Assistants and Nurse Practitioners) who all work together to provide you with the care you need, when you need it.  We recommend signing up for the patient portal called "MyChart".  Sign up information is provided on this After Visit Summary.  MyChart is used to connect with patients for Virtual Visits (Telemedicine).  Patients are able to view lab/test results, encounter notes, upcoming appointments, etc.  Non-urgent messages can be sent to your provider as well.   To learn more about what you can do with MyChart, go to [email or web address].    Your next appointment:   4 month(s)  Provider:   Dr. Flora Lipps

## 2022-10-22 LAB — BASIC METABOLIC PANEL
BUN/Creatinine Ratio: 22 (ref 10–24)
BUN: 17 mg/dL (ref 8–27)
CO2: 26 mmol/L (ref 20–29)
Calcium: 9 mg/dL (ref 8.6–10.2)
Chloride: 103 mmol/L (ref 96–106)
Creatinine, Ser: 0.79 mg/dL (ref 0.76–1.27)
Glucose: 80 mg/dL (ref 70–99)
Potassium: 4.9 mmol/L (ref 3.5–5.2)
Sodium: 142 mmol/L (ref 134–144)
eGFR: 102 mL/min/{1.73_m2} (ref >59)

## 2022-11-18 ENCOUNTER — Telehealth: Payer: Self-pay | Admitting: Internal Medicine

## 2022-11-18 NOTE — Telephone Encounter (Signed)
See note below

## 2022-11-18 NOTE — Telephone Encounter (Signed)
Inbound call from patient requesting to know if he is able to push his recall colonoscopy in October. Patient has a recall for December. Patient states he will be out of the country by then. Please advise on scheduling. Thank you.

## 2022-11-18 NOTE — Telephone Encounter (Signed)
Yes ok for surveillance exam in oct 2024

## 2022-11-18 NOTE — Telephone Encounter (Signed)
See note below and advise if ok to go ahead and schedule recall colon early.

## 2022-11-19 ENCOUNTER — Encounter: Payer: Self-pay | Admitting: Internal Medicine

## 2022-12-26 ENCOUNTER — Ambulatory Visit (AMBULATORY_SURGERY_CENTER): Payer: Medicare HMO

## 2022-12-26 ENCOUNTER — Encounter: Payer: Self-pay | Admitting: Internal Medicine

## 2022-12-26 VITALS — Ht 71.0 in | Wt 145.0 lb

## 2022-12-26 DIAGNOSIS — Z86010 Personal history of colonic polyps: Secondary | ICD-10-CM

## 2022-12-26 MED ORDER — NA SULFATE-K SULFATE-MG SULF 17.5-3.13-1.6 GM/177ML PO SOLN: 1.0000 | Freq: Once | ORAL | 0 refills | Status: AC

## 2022-12-26 NOTE — Progress Notes (Signed)
No egg or soy allergy known to patient  No issues known to pt with past sedation with any surgeries or procedures Patient denies ever being told they had issues or difficulty with intubation  No FH of Malignant Hyperthermia Pt is not on diet pills Pt is not on  home 02  Pt is not on blood thinners  Pt denies issues with constipation  No A fib or A flutter Have any cardiac testing pending--no  LOA: independent  Prep: 2 day suprep   Patient's chart reviewed by Cathlyn Parsons CNRA prior to previsit and patient appropriate for the [facility].  Previsit completed and red dot placed by patient's name on their procedure day (on provider's schedule).     PV competed with patient. Prep instructions sent via mychart and home address.

## 2023-01-06 DIAGNOSIS — A070 Balantidiasis: Secondary | ICD-10-CM | POA: Diagnosis not present

## 2023-01-13 DIAGNOSIS — F331 Major depressive disorder, recurrent, moderate: Secondary | ICD-10-CM | POA: Diagnosis not present

## 2023-01-13 DIAGNOSIS — F419 Anxiety disorder, unspecified: Secondary | ICD-10-CM | POA: Diagnosis not present

## 2023-01-14 ENCOUNTER — Encounter: Payer: Medicare HMO | Admitting: Internal Medicine

## 2023-01-23 DIAGNOSIS — F331 Major depressive disorder, recurrent, moderate: Secondary | ICD-10-CM | POA: Diagnosis not present

## 2023-01-23 DIAGNOSIS — F419 Anxiety disorder, unspecified: Secondary | ICD-10-CM | POA: Diagnosis not present

## 2023-02-23 ENCOUNTER — Encounter: Payer: Self-pay | Admitting: Internal Medicine

## 2023-03-09 ENCOUNTER — Ambulatory Visit: Payer: Medicare HMO | Admitting: Cardiovascular Disease

## 2023-03-10 ENCOUNTER — Encounter: Payer: Self-pay | Admitting: Podiatry

## 2023-03-13 ENCOUNTER — Other Ambulatory Visit (HOSPITAL_COMMUNITY): Payer: Self-pay

## 2023-03-20 ENCOUNTER — Other Ambulatory Visit (HOSPITAL_COMMUNITY): Payer: Self-pay

## 2023-04-03 ENCOUNTER — Telehealth: Payer: Self-pay

## 2023-04-03 NOTE — Telephone Encounter (Signed)
 Patient is aware you are out of the office this week. He has enough Gabapentin on hand to last until next Monday 04/06/2023. When you return please see the refill request  from the pharmacy on your desk.

## 2023-04-06 ENCOUNTER — Other Ambulatory Visit: Payer: Self-pay | Admitting: Physical Medicine and Rehabilitation

## 2023-04-06 ENCOUNTER — Ambulatory Visit: Payer: Medicare HMO | Admitting: Podiatry

## 2023-04-06 MED ORDER — GABAPENTIN 600 MG PO TABS: 1200.0000 mg | ORAL_TABLET | Freq: Four times a day (QID) | ORAL | 6 refills | Status: DC

## 2023-04-07 ENCOUNTER — Ambulatory Visit (INDEPENDENT_AMBULATORY_CARE_PROVIDER_SITE_OTHER): Payer: Medicare HMO

## 2023-04-07 ENCOUNTER — Encounter: Payer: Self-pay | Admitting: Podiatry

## 2023-04-07 ENCOUNTER — Ambulatory Visit: Payer: Medicare HMO | Admitting: Podiatry

## 2023-04-07 DIAGNOSIS — M21961 Unspecified acquired deformity of right lower leg: Secondary | ICD-10-CM

## 2023-04-07 DIAGNOSIS — M21962 Unspecified acquired deformity of left lower leg: Secondary | ICD-10-CM

## 2023-04-07 DIAGNOSIS — R202 Paresthesia of skin: Secondary | ICD-10-CM

## 2023-04-07 DIAGNOSIS — Z87828 Personal history of other (healed) physical injury and trauma: Secondary | ICD-10-CM

## 2023-04-07 DIAGNOSIS — L97411 Non-pressure chronic ulcer of right heel and midfoot limited to breakdown of skin: Secondary | ICD-10-CM | POA: Diagnosis not present

## 2023-04-07 DIAGNOSIS — M24574 Contracture, right foot: Secondary | ICD-10-CM

## 2023-04-07 DIAGNOSIS — M24575 Contracture, left foot: Secondary | ICD-10-CM

## 2023-04-07 MED ORDER — MUPIROCIN 2 % EX OINT: TOPICAL_OINTMENT | CUTANEOUS | 1 refills | Status: DC
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
## 2023-04-08 ENCOUNTER — Other Ambulatory Visit: Payer: Self-pay | Admitting: Physical Medicine and Rehabilitation

## 2023-04-08 ENCOUNTER — Telehealth: Payer: Self-pay

## 2023-04-08 MED ORDER — GABAPENTIN 600 MG PO TABS: 600.0000 mg | ORAL_TABLET | ORAL | 6 refills | Status: DC

## 2023-04-08 NOTE — Telephone Encounter (Signed)
 Patient called stating he needs his Gabapentin daily quantity change from 8 a day to 6 a day in order for his insurance to pay.

## 2023-04-09 NOTE — Telephone Encounter (Signed)
 Pt.notified

## 2023-04-15 NOTE — Progress Notes (Signed)
Cardiology Office Note:  .   Date:  04/16/2023  ID:  [name], DOB 01/28/1961, MRN [id number] PCP: Mliss Sax, MD  [facility] Providers Cardiologist:  None Electrophysiologist:  Lanier Prude, MD { History of Present Illness: .   [name] is a 63 y.o. male with history of CHF, LBBB, CAD who presents for follow-up.    History of Present Illness   [name], a male with a history of non-ischemic cardiomyopathy, presents for a follow-up visit. He reports no new health concerns and has been maintaining an active lifestyle, including recent travels to various countries. He has been on Entresto for heart function and reports no adverse effects such as dizziness or lightheadedness. He notes that his limitations seem more related to his spinal cord condition rather than his heart. He denies experiencing any trouble breathing or swelling in his legs.  In addition to his heart condition, [name] has been dealing with a recurring wound on his right foot, likely due to a lack of feeling below his right knee from a previous spinal cord injury. He is scheduled to see a foot surgeon for further evaluation and potential treatment.          Problem List LBBB -QRS 154 ms 1AVB Systolic HF -EF 40-45% 05/2021 -EF 35-40% 12/2021 -EF 41% CMR 06/04/2022 -no LGE on CMR 4. CAD -CAC score 283 (95th percentile) -mild 25-49% LAD/RCA -T chol 169, HDL 72, LDL 80, TG 95    ROS: All other ROS reviewed and negative. Pertinent positives noted in the HPI.     Studies Reviewed: Marland Kitchen   EKG Interpretation Date/Time:  Thursday April 16 2023 13:39:39 EST Ventricular Rate:  59 PR Interval:  222 QRS Duration:  154 QT Interval:  434 QTC Calculation: 429 R Axis:   -25  Text Interpretation: Sinus bradycardia with 1st degree A-V block Left bundle branch block When compared with ECG of 31-May-2021 12:19, No significant change was found Confirmed by Lennie Odor [phone]) on  04/16/2023 2:02:33 PM   Physical Exam:   VS:  BP 98/70 (BP Location: Left Arm, Patient Position: Sitting, Cuff Size: Normal)   Pulse (!) 59   Ht 5\' 11"  (1.803 m)   Wt 152 lb (68.9 kg)   BMI 21.20 kg/m    Wt Readings from Last 3 Encounters:  04/16/23 152 lb (68.9 kg)  12/26/22 145 lb (65.8 kg)  10/21/22 146 lb 6.4 oz (66.4 kg)    GEN: Well nourished, well developed in no acute distress NECK: No JVD; No carotid bruits CARDIAC: RRR, no murmurs, rubs, gallops RESPIRATORY:  Clear to auscultation without rales, wheezing or rhonchi  ABDOMEN: Soft, non-tender, non-distended EXTREMITIES:  No edema; No deformity  ASSESSMENT AND PLAN: .   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 EF was 41% on last cardiac MRI. Currently on Entresto 24/26mg  BID, Metoprolol succinate 25mg  daily, Aldactone 25mg  daily, and Jardiance 10mg  daily. No symptoms of heart failure reported. -Order repeat echocardiogram to assess current heart function. -Continue current medications.  Hyperlipidemia Nonobstructive CAD On Lipitor 10mg  daily. No recent lipid panel. -Order lipid panel, CBC, and BMP today.  Follow-up in 1 year unless issues arise.              Follow-up: Return in about 1 year (around 04/15/2024).  Signed, Lenna Gilford. Flora Lipps, MD, [facility]  [facility]  [address] [place], [place] [address] [phone]  2:17 PM

## 2023-04-16 ENCOUNTER — Ambulatory Visit: Payer: PPO | Attending: Cardiovascular Disease | Admitting: Cardiovascular Disease

## 2023-04-16 ENCOUNTER — Encounter: Payer: Self-pay | Admitting: Cardiovascular Disease

## 2023-04-16 VITALS — BP 98/70 | HR 59 | Ht 71.0 in | Wt 152.0 lb

## 2023-04-16 DIAGNOSIS — I2510 Atherosclerotic heart disease of native coronary artery without angina pectoris: Secondary | ICD-10-CM | POA: Diagnosis not present

## 2023-04-16 DIAGNOSIS — I447 Left bundle-branch block, unspecified: Secondary | ICD-10-CM

## 2023-04-16 DIAGNOSIS — E782 Mixed hyperlipidemia: Secondary | ICD-10-CM | POA: Diagnosis not present

## 2023-04-16 DIAGNOSIS — I5022 Chronic systolic (congestive) heart failure: Secondary | ICD-10-CM

## 2023-04-16 NOTE — Patient Instructions (Addendum)
Medication Instructions:  Your physician recommends that you continue on your current medications as directed. Please refer to the Current Medication list given to you today.  *If you need a refill on your cardiac medications before your next appointment, please call your pharmacy*   Lab Work: Lipid panel, CBC, B MET If you have labs (blood work) drawn today and your tests are completely normal, you will receive your results only by: MyChart Message (if you have MyChart) OR A paper copy in the mail If you have any lab test that is abnormal or we need to change your treatment, we will call you to review the results.   Testing/Procedures: Echo will be scheduled at [address].  Your physician has requested that you have an echocardiogram. Echocardiography is a painless test that uses sound waves to create images of your heart. It provides your doctor with information about the size and shape of your heart and how well your heart's chambers and valves are working. This procedure takes approximately one hour. There are no restrictions for this procedure. Please do NOT wear cologne, perfume, aftershave, or lotions (deodorant is allowed). Please arrive 15 minutes prior to your appointment time.   Follow-Up: At [facility], you and your health needs are our priority.  As part of our continuing mission to provide you with exceptional heart care, we have created designated Provider Care Teams.  These Care Teams include your primary Cardiologist (physician) and Advanced Practice Providers (APPs -  Physician Assistants and Nurse Practitioners) who all work together to provide you with the care you need, when you need it.   Your next appointment:   1 yr  Provider:   Dr. Flora Lipps

## 2023-04-17 DIAGNOSIS — F411 Generalized anxiety disorder: Secondary | ICD-10-CM | POA: Diagnosis not present

## 2023-04-17 DIAGNOSIS — F3342 Major depressive disorder, recurrent, in full remission: Secondary | ICD-10-CM | POA: Diagnosis not present

## 2023-04-17 LAB — LIPID PANEL
Chol/HDL Ratio: 2 {ratio} (ref 0.0–5.0)
Cholesterol, Total: 124 mg/dL (ref 100–199)
HDL: 61 mg/dL (ref >39)
LDL Chol Calc (NIH): 51 mg/dL (ref 0–99)
Triglycerides: 55 mg/dL (ref 0–149)
VLDL Cholesterol Cal: 12 mg/dL (ref 5–40)

## 2023-04-17 LAB — CBC
Hematocrit: 45.2 % (ref 37.5–51.0)
Hemoglobin: 15.1 g/dL (ref 13.0–17.7)
MCH: 30.9 pg (ref 26.6–33.0)
MCHC: 33.4 g/dL (ref 31.5–35.7)
MCV: 93 fL (ref 79–97)
Platelets: 211 10*3/uL (ref 150–450)
RBC: 4.88 x10E6/uL (ref 4.14–5.80)
RDW: 12.7 % (ref 11.6–15.4)
WBC: 4.4 10*3/uL (ref 3.4–10.8)

## 2023-04-17 LAB — BASIC METABOLIC PANEL
BUN/Creatinine Ratio: 12 (ref 10–24)
BUN: 10 mg/dL (ref 8–27)
CO2: 26 mmol/L (ref 20–29)
Calcium: 9.2 mg/dL (ref 8.6–10.2)
Chloride: 101 mmol/L (ref 96–106)
Creatinine, Ser: 0.83 mg/dL (ref 0.76–1.27)
Glucose: 77 mg/dL (ref 70–99)
Potassium: 5.1 mmol/L (ref 3.5–5.2)
Sodium: 141 mmol/L (ref 134–144)
eGFR: 100 mL/min/{1.73_m2} (ref >59)

## 2023-04-21 ENCOUNTER — Ambulatory Visit (INDEPENDENT_AMBULATORY_CARE_PROVIDER_SITE_OTHER): Payer: PPO | Admitting: Podiatry

## 2023-04-21 ENCOUNTER — Telehealth: Payer: Self-pay | Admitting: Podiatry

## 2023-04-21 ENCOUNTER — Ambulatory Visit (INDEPENDENT_AMBULATORY_CARE_PROVIDER_SITE_OTHER): Payer: PPO

## 2023-04-21 DIAGNOSIS — Q6671 Congenital pes cavus, right foot: Secondary | ICD-10-CM

## 2023-04-21 NOTE — Progress Notes (Signed)
  Subjective:  Patient ID: [name], male    DOB: 1960/11/01,  MRN: [id number]  Chief Complaint  Patient presents with   Foot Ulcer    RT back of heel. Since last visit wound has improved with use of mupirocin oint. Concerned about wearing boots and regular shoes because once he does so the wound opens back up. Also had episode of cracked heel skin with bleeding this AM. LT  heel is doing okay, no open wounds.     63 y.o. male presents with the above complaint. History confirmed with patient.  Patient presents today evaluate his plantar and posterior right heel, otherwise a chronic wound that opens and closes he previously had a bout of osteomyelitis about 10 years ago this worsened after a spinal cord injury from an accident.  He does not have pain but does not have much of sensation in his right lower extremity.  He does have some muscle weakness and was recommended use a foot drop brace initially after the injury but has not used 1 and does not feel that his foot significantly drags or drops on the ground  Objective:  Physical Exam: warm, good capillary refill, normal DP and PT pulses, and healed posterior right heel ulceration with no signs of infection he has severe pes cavus foot deformity compared to his left lower extremity, he has semirigid hammertoe contractures of all digits   Radiographs: Multiple views x-ray of the right foot: Previous right foot radiographs taken 04/07/2023 and new ankle and calcaneal axial views taken today show pes cavus foot deformity with C-shaped calcaneus, ankle mortise is aligned there are degenerative changes noted in the ankle and subtalar joint, he has previous fibular fracture with ORIF plate screw and wire fixation Assessment:   1. Pes cavus of right foot      Plan:  Patient was evaluated and treated and all questions answered.  Reviewed his radiographs and his complex deformity with him currently he has minimal functional limitation and does  not have any pain due to his neuropathic limb in this lower extremity the primary issue is the current ulceration that waxes and wanes.  Does not have any active signs of infection radiographically or clinically today, he primarily worries about walking traveling and the waxing and waning of the wound.  From a surgical standpoint likely would need significant reconstruction with wire osteotomy, TA tendon transfer, peroneal transfer and first metatarsal dorsiflexion osteotomy and flexor tenotomy versus TTC fusion, discussed with him MRI and lab work to ensure eradication of previous infections have been cleared to be necessary prior to this.  We discussed this will take quite a bit of recovery about 3 to 4 months.  He would like to hold off on this and continue to pursue nonoperative treatment.  We will look at shoe options and he is planning to meet with Trish our orthotist here next week, I also recommend using foam aperture callus pads over the lesion and a Tuli's heel cup.  He will follow-up me in 6 weeks to reevaluate  Return in about 6 weeks (around 06/02/2023) for re-evaluate heel .

## 2023-04-21 NOTE — Patient Instructions (Addendum)
Try a Tuli's heel cup to see if this works better at alleviating the pressure point

## 2023-04-30 ENCOUNTER — Ambulatory Visit: Payer: PPO

## 2023-04-30 NOTE — Progress Notes (Signed)
Patient was in with shoes to see if I could modify heel area of right shoe where bony area of heel rubs into counter causing it to break down and then bone rubs on rigid plastic in heel counter causing wound. No current wound at this time. I was able to glue piece of plastizote inside shoe cupping the heel and bony area of heel. Plastizote is a material that reduces sheer, friction and pressure to skin, if this works well for patient he will be dropping other shoes off to have same done   [employer], CFo, CFm

## 2023-05-01 DIAGNOSIS — M216X1 Other acquired deformities of right foot: Secondary | ICD-10-CM | POA: Diagnosis not present

## 2023-05-01 DIAGNOSIS — M79671 Pain in right foot: Secondary | ICD-10-CM | POA: Diagnosis not present

## 2023-05-12 ENCOUNTER — Telehealth: Payer: Self-pay

## 2023-05-12 ENCOUNTER — Other Ambulatory Visit: Payer: Self-pay | Admitting: Physical Medicine and Rehabilitation

## 2023-05-12 MED ORDER — GABAPENTIN 600 MG PO TABS: 600.0000 mg | ORAL_TABLET | ORAL | 6 refills | Status: DC

## 2023-05-12 NOTE — Telephone Encounter (Signed)
Refill requested for gabapentin to be sent to [facility] [address]

## 2023-05-15 DIAGNOSIS — F4312 Post-traumatic stress disorder, chronic: Secondary | ICD-10-CM | POA: Diagnosis not present

## 2023-05-15 DIAGNOSIS — F411 Generalized anxiety disorder: Secondary | ICD-10-CM | POA: Diagnosis not present

## 2023-05-15 DIAGNOSIS — F3342 Major depressive disorder, recurrent, in full remission: Secondary | ICD-10-CM | POA: Diagnosis not present

## 2023-05-21 ENCOUNTER — Ambulatory Visit (HOSPITAL_COMMUNITY)
Admission: RE | Admit: 2023-05-21 | Discharge: 2023-05-21 | Disposition: A | Discharge status: Home / Self Care | Payer: PPO | Source: Ambulatory Visit | Attending: Cardiovascular Disease | Admitting: Cardiovascular Disease

## 2023-05-21 DIAGNOSIS — I5022 Chronic systolic (congestive) heart failure: Secondary | ICD-10-CM | POA: Diagnosis not present

## 2023-05-21 DIAGNOSIS — I2510 Atherosclerotic heart disease of native coronary artery without angina pectoris: Secondary | ICD-10-CM | POA: Insufficient documentation

## 2023-05-21 LAB — ECHOCARDIOGRAM COMPLETE
AR max vel: 3.47 cm2
AV Area VTI: 3.73 cm2
AV Area mean vel: 3.48 cm2
AV Mean grad: 3 mm[Hg]
AV Peak grad: 6.4 mm[Hg]
Ao pk vel: 1.26 m/s
Area-P 1/2: 3.34 cm2
S' Lateral: 3.46 cm
Single Plane A4C EF: 58.6 %

## 2023-05-22 ENCOUNTER — Encounter: Payer: Self-pay | Admitting: Cardiovascular Disease

## 2023-06-02 ENCOUNTER — Ambulatory Visit: Payer: PPO | Admitting: Podiatry

## 2023-06-22 ENCOUNTER — Ambulatory Visit: Payer: Medicare HMO | Admitting: Physical Medicine and Rehabilitation

## 2023-08-25 ENCOUNTER — Ambulatory Visit: Payer: Medicare HMO | Admitting: Physical Medicine and Rehabilitation

## 2023-08-27 ENCOUNTER — Other Ambulatory Visit: Payer: Self-pay | Admitting: Cardiovascular Disease

## 2023-09-04 DIAGNOSIS — F4310 Post-traumatic stress disorder, unspecified: Secondary | ICD-10-CM | POA: Diagnosis not present

## 2023-09-04 DIAGNOSIS — F411 Generalized anxiety disorder: Secondary | ICD-10-CM | POA: Diagnosis not present

## 2023-09-04 DIAGNOSIS — F3342 Major depressive disorder, recurrent, in full remission: Secondary | ICD-10-CM | POA: Diagnosis not present

## 2023-09-07 ENCOUNTER — Encounter: Payer: Medicare HMO | Attending: Physical Medicine and Rehabilitation | Admitting: Physical Medicine and Rehabilitation

## 2023-09-07 ENCOUNTER — Encounter: Payer: Self-pay | Admitting: Physical Medicine and Rehabilitation

## 2023-09-07 VITALS — BP 107/7 | HR 65 | Ht 71.0 in | Wt 145.0 lb

## 2023-09-07 DIAGNOSIS — M9261 Juvenile osteochondrosis of tarsus, right ankle: Secondary | ICD-10-CM | POA: Insufficient documentation

## 2023-09-07 DIAGNOSIS — M48062 Spinal stenosis, lumbar region with neurogenic claudication: Secondary | ICD-10-CM | POA: Insufficient documentation

## 2023-09-07 DIAGNOSIS — M9262 Juvenile osteochondrosis of tarsus, left ankle: Secondary | ICD-10-CM | POA: Insufficient documentation

## 2023-09-07 NOTE — Patient Instructions (Signed)
 ECSWT, extracorporeal shockwave therapy
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
## 2023-12-26 ENCOUNTER — Other Ambulatory Visit: Payer: Self-pay | Admitting: Cardiovascular Disease

## 2023-12-28 ENCOUNTER — Telehealth: Payer: Self-pay

## 2023-12-28 NOTE — Telephone Encounter (Signed)
 PA for Gabapentin submitted

## 2024-01-01 DIAGNOSIS — F4310 Post-traumatic stress disorder, unspecified: Secondary | ICD-10-CM | POA: Diagnosis not present

## 2024-01-01 DIAGNOSIS — F411 Generalized anxiety disorder: Secondary | ICD-10-CM | POA: Diagnosis not present

## 2024-01-01 DIAGNOSIS — F3342 Major depressive disorder, recurrent, in full remission: Secondary | ICD-10-CM | POA: Diagnosis not present

## 2024-01-04 NOTE — Telephone Encounter (Signed)
 Gabapentin  was not approved. Form for appeal if needed on Dr. Lennette desk.

## 2024-01-06 ENCOUNTER — Other Ambulatory Visit: Payer: Self-pay | Admitting: Physical Medicine and Rehabilitation

## 2024-01-06 MED ORDER — GABAPENTIN 600 MG PO TABS: 600.0000 mg | ORAL_TABLET | ORAL | 6 refills | Status: AC

## 2024-01-18 DIAGNOSIS — Z85828 Personal history of other malignant neoplasm of skin: Secondary | ICD-10-CM | POA: Diagnosis not present

## 2024-01-18 DIAGNOSIS — L111 Transient acantholytic dermatosis [Grover]: Secondary | ICD-10-CM | POA: Diagnosis not present

## 2024-01-18 DIAGNOSIS — D485 Neoplasm of uncertain behavior of skin: Secondary | ICD-10-CM | POA: Diagnosis not present

## 2024-01-18 DIAGNOSIS — L570 Actinic keratosis: Secondary | ICD-10-CM | POA: Diagnosis not present

## 2024-01-18 DIAGNOSIS — C44719 Basal cell carcinoma of skin of left lower limb, including hip: Secondary | ICD-10-CM | POA: Diagnosis not present

## 2024-01-18 DIAGNOSIS — D692 Other nonthrombocytopenic purpura: Secondary | ICD-10-CM | POA: Diagnosis not present

## 2024-01-18 DIAGNOSIS — L905 Scar conditions and fibrosis of skin: Secondary | ICD-10-CM | POA: Diagnosis not present

## 2024-01-18 DIAGNOSIS — L821 Other seborrheic keratosis: Secondary | ICD-10-CM | POA: Diagnosis not present

## 2024-02-20 ENCOUNTER — Other Ambulatory Visit: Payer: Self-pay | Admitting: Cardiovascular Disease

## 2024-02-23 ENCOUNTER — Other Ambulatory Visit: Payer: Self-pay | Admitting: Cardiovascular Disease

## 2024-02-23 ENCOUNTER — Telehealth: Payer: Self-pay | Admitting: Cardiovascular Disease

## 2024-02-23 MED ORDER — ATORVASTATIN CALCIUM 10 MG PO TABS: 10.0000 mg | ORAL_TABLET | Freq: Every day | ORAL | 0 refills | Status: DC

## 2024-02-23 MED ORDER — SACUBITRIL-VALSARTAN 24-26 MG PO TABS: 1.0000 | ORAL_TABLET | Freq: Two times a day (BID) | ORAL | 0 refills | Status: DC

## 2024-02-23 MED ORDER — METOPROLOL SUCCINATE ER 25 MG PO TB24: 25.0000 mg | ORAL_TABLET | Freq: Every day | ORAL | 0 refills | Status: DC

## 2024-02-23 NOTE — Telephone Encounter (Signed)
 Refills have been sent in to get patient to his next appointment with Dr. Burton in February. Patient will need a BMET at next appointment.

## 2024-02-23 NOTE — Telephone Encounter (Signed)
 Pt did not get his spironolactone  (ALDACTONE ) 25 MG tablet . Please send

## 2024-02-23 NOTE — Telephone Encounter (Signed)
*  STAT* If patient is at the pharmacy, call can be transferred to refill team.   1. Which medications need to be refilled? (please list name of each medication and dose if known) JARDIANCE  10 MG TABS tablet  metoprolol  succinate (TOPROL -XL) 25 MG 24 hr tablet  sacubitril -valsartan  (ENTRESTO ) 24-26 MG atorvastatin  (LIPITOR) 10 MG tablet   spironolactone  (ALDACTONE ) 25 MG tablet   2. Would you like to learn more about the convenience, safety, & potential cost savings by using the [facility]? No    3. Are you open to using the [facility] (Type [facility]. No    4. Which pharmacy/location (including street and city if local pharmacy) is medication to be sent to?[facility] [address], [place] - [address]    5. Do they need a 30 day or 90 day supply? 90 day

## 2024-02-23 NOTE — Telephone Encounter (Signed)
 Returned call to pt.  Left a message that his last refill was good for 90 days, which will be up 03/28/24.  Left message that he is due in January for follow-up and to call and get that scheduled and if needs another refill, we can get that sent.

## 2024-02-23 NOTE — Telephone Encounter (Signed)
 All medications have been filled. These medication were refilled in other encounters.

## 2024-02-23 NOTE — Telephone Encounter (Signed)
 Patient is scheduled to see Dr. Barbaraann on 05/03/2023. Patient stated he is able to have a vacation override and would like the refill sent before 11/26 due to leaving out of town on 11/27 and will not be back until January. Please advise.

## 2024-02-25 ENCOUNTER — Emergency Department (HOSPITAL_COMMUNITY)
Admission: EM | Admit: 2024-02-25 | Discharge: 2024-02-25 | Disposition: A | Discharge status: Home / Self Care | Attending: Emergency Medicine | Admitting: Emergency Medicine

## 2024-02-25 ENCOUNTER — Other Ambulatory Visit: Payer: Self-pay

## 2024-02-25 DIAGNOSIS — Y9301 Activity, walking, marching and hiking: Secondary | ICD-10-CM | POA: Diagnosis not present

## 2024-02-25 DIAGNOSIS — S90821A Blister (nonthermal), right foot, initial encounter: Secondary | ICD-10-CM | POA: Diagnosis not present

## 2024-02-25 DIAGNOSIS — X58XXXA Exposure to other specified factors, initial encounter: Secondary | ICD-10-CM | POA: Insufficient documentation

## 2024-02-25 DIAGNOSIS — Z8616 Personal history of COVID-19: Secondary | ICD-10-CM | POA: Insufficient documentation
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
## 2024-02-25 NOTE — ED Triage Notes (Signed)
 Pt arrives POV. Pt reports new wound to posterior right heel. Pt reports wearing new shoes for hiking and noticing wound once he took the shoes off. Pt Hx haglund deformity. Pt Hx spinal cord injury resulting in BK paraesthesia.

## 2024-04-14 ENCOUNTER — Other Ambulatory Visit (HOSPITAL_COMMUNITY): Payer: Self-pay

## 2024-04-18 ENCOUNTER — Telehealth: Payer: Self-pay | Admitting: Pharmacy Technician

## 2024-04-18 ENCOUNTER — Other Ambulatory Visit (HOSPITAL_COMMUNITY): Payer: Self-pay

## 2024-04-18 NOTE — Telephone Encounter (Signed)
" ° ° °  Insurance will pay for generic. Filled 04/13/24    "

## 2024-05-01 NOTE — Progress Notes (Unsigned)
 " Cardiology Office Note:  .   Date:  05/03/2024  ID:  [name], DOB Sep 20, 1960, MRN [id number] PCP: Berneta Elsie Sayre, MD  [facility] Providers Cardiologist:  None Electrophysiologist:  Justin ONEIDA HOLTS, MD (Inactive)   History of Present Illness: .    Chief Complaint  Patient presents with   Follow-up         [name] is a 64 y.o. male with below history who presents for follow-up.   History of Present Illness   [name] is a 64 year old male with systolic heart failure who presents for follow-up.  He has a history of systolic heart failure with a recent ejection fraction of 55-60%. No symptoms of heart failure such as chest pain or trouble breathing are present. His last heart ultrasound in February 2025 was reported to have improved function. He reports that he continues to exercise regularly and does not experience any heart-related limitations.  He has mild coronary artery disease and hyperlipidemia. He has not had his cholesterol checked this year and is due for his annual panel, with the last check being in January of the previous year. He is currently taking Lipitor (atorvastatin ) 10 mg daily.  He is on multiple cardiac medications including Jardiance  10 mg daily, metoprolol  succinate 25 mg daily, Entresto  24/26 mg BID, and spironolactone  25 mg daily. He recently refilled his Entresto  and found it to be cheaper as a generic, although it is still provided by the same company. He requires refills for all his medications except Entresto , which he recently refilled for 30 days.  He has been traveling frequently and is currently cat sitting for his sister. He has no immediate travel plans until July. He has lived in [place] for 30 years and prefers cooler weather.           Problem List LBBB -QRS 154 ms 1AVB Systolic HF -EF 40-45% 05/2021 -EF 35-40% 12/2021 -EF 41% CMR 06/04/2022 (no LGE) -EF 55-60% 05/21/2023 4. CAD -CAC score 283 (95th  percentile) -mild 25-49% LAD/RCA -T chol 124, HDL 61, LDL 51, TG 55    ROS: All other ROS reviewed and negative. Pertinent positives noted in the HPI.     Studies Reviewed: SABRA   EKG Interpretation Date/Time:  Tuesday May 03 2024 13:14:41 EST Ventricular Rate:  64 PR Interval:  228 QRS Duration:  146 QT Interval:  426 QTC Calculation: 439 R Axis:   -48  Text Interpretation: Sinus rhythm with 1st degree A-V block Left bundle branch block Confirmed by Barbaraann Kotyk [phone]) on 05/03/2024 1:15:59 PM   TTE 05/21/2023  1. Left ventricular ejection fraction, by estimation, is 55 to 60%. The  left ventricle has normal function. The left ventricle has no regional  wall motion abnormalities. Left ventricular diastolic parameters were  normal.   2. Right ventricular systolic function is normal. The right ventricular  size is normal.   3. The mitral valve is normal in structure. No evidence of mitral valve  regurgitation. No evidence of mitral stenosis.   4. The aortic valve is tricuspid. Aortic valve regurgitation is not  visualized. No aortic stenosis is present.   5. The inferior vena cava is normal in size with greater than 50%  respiratory variability, suggesting right atrial pressure of 3 mmHg.   Physical Exam:   VS:  BP 114/60   Pulse 71   Ht 5' 11 (1.803 m)   Wt 155 lb (70.3 kg)   SpO2 99%  BMI 21.62 kg/m    Wt Readings from Last 3 Encounters:  05/03/24 155 lb (70.3 kg)  09/07/23 145 lb (65.8 kg)  04/16/23 152 lb (68.9 kg)    GEN: Well nourished, well developed in no acute distress NECK: No JVD; No carotid bruits CARDIAC: RRR, no murmurs, rubs, gallops RESPIRATORY:  Clear to auscultation without rales, wheezing or rhonchi  ABDOMEN: Soft, non-tender, non-distended EXTREMITIES:  No edema; No deformity  ASSESSMENT AND PLAN: .   Assessment and Plan    Chronic systolic heart failure related to left bundle branch block (EF 55-60%) Heart failure well-controlled, EF  55-60%, no symptoms, LBBB stable. - Continue Jardiance  10 mg daily. - Continue metoprolol  succinate 25 mg daily. - Continue Entresto  24/26 mg BID. - Continue spironolactone  25 mg daily. - Plan echocardiogram next year unless symptoms change.  Nonobstructive coronary artery disease Condition well-managed, no recent chest pain or dyspnea. - Continue Lipitor 10 mg daily.  Mixed hyperlipidemia LDL at goal, managed with Lipitor. - Ordered lipid panel. - Continue Lipitor 10 mg daily.                Follow-up: Return in about 1 year (around 05/03/2025).  Signed, Darryle DASEN. Barbaraann, MD, [facility]  [facility]  [address] [place], [place] [address] [phone]  1:41 PM   "

## 2024-05-02 ENCOUNTER — Ambulatory Visit: Admitting: Cardiovascular Disease

## 2024-05-03 ENCOUNTER — Encounter (HOSPITAL_BASED_OUTPATIENT_CLINIC_OR_DEPARTMENT_OTHER): Payer: Self-pay | Admitting: Cardiovascular Disease

## 2024-05-03 ENCOUNTER — Ambulatory Visit (HOSPITAL_BASED_OUTPATIENT_CLINIC_OR_DEPARTMENT_OTHER): Admitting: Cardiovascular Disease

## 2024-05-03 VITALS — BP 114/60 | HR 71 | Ht 71.0 in | Wt 155.0 lb

## 2024-05-03 DIAGNOSIS — E782 Mixed hyperlipidemia: Secondary | ICD-10-CM

## 2024-05-03 DIAGNOSIS — I5022 Chronic systolic (congestive) heart failure: Secondary | ICD-10-CM

## 2024-05-03 DIAGNOSIS — Z5181 Encounter for therapeutic drug level monitoring: Secondary | ICD-10-CM | POA: Diagnosis not present

## 2024-05-03 DIAGNOSIS — Z862 Personal history of diseases of the blood and blood-forming organs and certain disorders involving the immune mechanism: Secondary | ICD-10-CM

## 2024-05-03 DIAGNOSIS — I447 Left bundle-branch block, unspecified: Secondary | ICD-10-CM

## 2024-05-03 DIAGNOSIS — I2510 Atherosclerotic heart disease of native coronary artery without angina pectoris: Secondary | ICD-10-CM | POA: Diagnosis not present

## 2024-05-03 LAB — CBC WITH DIFFERENTIAL/PLATELET
Basophils Absolute: 0 10*3/uL (ref 0.0–0.2)
Basos: 1 %
EOS (ABSOLUTE): 0.1 10*3/uL (ref 0.0–0.4)
Eos: 2 %
Hematocrit: 46 % (ref 37.5–51.0)
Hemoglobin: 15.3 g/dL (ref 13.0–17.7)
Immature Grans (Abs): 0 10*3/uL (ref 0.0–0.1)
Immature Granulocytes: 0 %
Lymphocytes Absolute: 1.6 10*3/uL (ref 0.7–3.1)
Lymphs: 29 %
MCH: 31.2 pg (ref 26.6–33.0)
MCHC: 33.3 g/dL (ref 31.5–35.7)
MCV: 94 fL (ref 79–97)
Monocytes Absolute: 0.4 10*3/uL (ref 0.1–0.9)
Monocytes: 7 %
Neutrophils Absolute: 3.5 10*3/uL (ref 1.4–7.0)
Neutrophils: 61 %
Platelets: 201 10*3/uL (ref 150–450)
RBC: 4.9 x10E6/uL (ref 4.14–5.80)
RDW: 12.8 % (ref 11.6–15.4)
WBC: 5.7 10*3/uL (ref 3.4–10.8)

## 2024-05-03 LAB — TSH: TSH: 1.29 u[IU]/mL (ref 0.450–4.500)

## 2024-05-03 LAB — COMPREHENSIVE METABOLIC PANEL WITH GFR
ALT: 16 [IU]/L (ref 0–44)
AST: 19 [IU]/L (ref 0–40)
Albumin: 4.4 g/dL (ref 3.9–4.9)
Alkaline Phosphatase: 85 [IU]/L (ref 47–123)
BUN/Creatinine Ratio: 16 (ref 10–24)
BUN: 15 mg/dL (ref 8–27)
Bilirubin Total: 0.6 mg/dL (ref 0.0–1.2)
CO2: 24 mmol/L (ref 20–29)
Calcium: 9.7 mg/dL (ref 8.6–10.2)
Chloride: 99 mmol/L (ref 96–106)
Creatinine, Ser: 0.95 mg/dL (ref 0.76–1.27)
Globulin, Total: 2 g/dL (ref 1.5–4.5)
Glucose: 80 mg/dL (ref 70–99)
Potassium: 4.9 mmol/L (ref 3.5–5.2)
Sodium: 137 mmol/L (ref 134–144)
Total Protein: 6.4 g/dL (ref 6.0–8.5)
eGFR: 91 mL/min/{1.73_m2}

## 2024-05-03 LAB — LIPID PANEL
Chol/HDL Ratio: 1.7 ratio (ref 0.0–5.0)
Cholesterol, Total: 162 mg/dL (ref 100–199)
HDL: 94 mg/dL
LDL Chol Calc (NIH): 56 mg/dL (ref 0–99)
Triglycerides: 60 mg/dL (ref 0–149)
VLDL Cholesterol Cal: 12 mg/dL (ref 5–40)

## 2024-05-03 MED ORDER — EMPAGLIFLOZIN 10 MG PO TABS: 10.0000 mg | ORAL_TABLET | Freq: Every day | ORAL | 3 refills | Status: AC

## 2024-05-03 MED ORDER — METOPROLOL SUCCINATE ER 25 MG PO TB24: 25.0000 mg | ORAL_TABLET | Freq: Every day | ORAL | 3 refills | Status: AC

## 2024-05-03 MED ORDER — ATORVASTATIN CALCIUM 10 MG PO TABS: 10.0000 mg | ORAL_TABLET | Freq: Every day | ORAL | 3 refills | Status: AC

## 2024-05-03 MED ORDER — SPIRONOLACTONE 25 MG PO TABS: 25.0000 mg | ORAL_TABLET | Freq: Every day | ORAL | 3 refills | Status: AC

## 2024-05-03 MED ORDER — SACUBITRIL-VALSARTAN 24-26 MG PO TABS: 1.0000 | ORAL_TABLET | Freq: Two times a day (BID) | ORAL | 3 refills | Status: AC

## 2024-05-03 NOTE — Patient Instructions (Signed)
 Medication Instructions:  Your physician recommends that you continue on your current medications as directed. Please refer to the Current Medication list given to you today.   *If you need a refill on your cardiac medications before your next appointment, please call your pharmacy*  Lab Work: FASTING LIPID/CMET/TSH/CBC SOON   If you have labs (blood work) drawn today and your tests are completely normal, you will receive your results only by: MyChart Message (if you have MyChart) OR A paper copy in the mail If you have any lab test that is abnormal or we need to change your treatment, we will call you to review the results.  Testing/Procedures: NONE  Follow-Up: At [facility], you and your health needs are our priority.  As part of our continuing mission to provide you with exceptional heart care, our providers are all part of one team.  This team includes your primary Cardiologist (physician) and Advanced Practice Providers or APPs (Physician Assistants and Nurse Practitioners) who all work together to provide you with the care you need, when you need it.  Your next appointment:   12 month(s)  Provider:   Rosaline Bane, NP or Reche Finder, NP    We recommend signing up for the patient portal called MyChart.  Sign up information is provided on this After Visit Summary.  MyChart is used to connect with patients for Virtual Visits (Telemedicine).  Patients are able to view lab/test results, encounter notes, upcoming appointments, etc.  Non-urgent messages can be sent to your provider as well.   To learn more about what you can do with MyChart, go to [email or web address].   Other Instructions

## 2024-05-04 ENCOUNTER — Ambulatory Visit: Payer: Self-pay | Admitting: Cardiovascular Disease

## 2024-09-06 ENCOUNTER — Ambulatory Visit: Admitting: Physical Medicine and Rehabilitation
